# Patient Record
Sex: Male | Born: 1985 | Race: Black or African American | Hispanic: No | Marital: Married | Smoking: Current some day smoker
Health system: Southern US, Community
[De-identification: ages and names within clinical notes are randomized; demographics above are authoritative.]

## PROBLEM LIST (undated history)

## (undated) DIAGNOSIS — I10 Essential (primary) hypertension: Secondary | ICD-10-CM

## (undated) DIAGNOSIS — I2699 Other pulmonary embolism without acute cor pulmonale: Secondary | ICD-10-CM

## (undated) DIAGNOSIS — I824Y9 Acute embolism and thrombosis of unspecified deep veins of unspecified proximal lower extremity: Secondary | ICD-10-CM

---

## 2018-05-23 ENCOUNTER — Encounter (HOSPITAL_COMMUNITY): Payer: Self-pay

## 2018-05-23 ENCOUNTER — Other Ambulatory Visit: Payer: Self-pay

## 2018-05-23 ENCOUNTER — Inpatient Hospital Stay (HOSPITAL_COMMUNITY)
Admission: EM | Admit: 2018-05-23 | Discharge: 2018-05-25 | DRG: 176 | Disposition: A | Discharge status: Home / Self Care | Payer: 59 | Attending: Internal Medicine | Admitting: Internal Medicine

## 2018-05-23 ENCOUNTER — Inpatient Hospital Stay (HOSPITAL_COMMUNITY): Payer: 59

## 2018-05-23 ENCOUNTER — Emergency Department (HOSPITAL_COMMUNITY): Payer: 59

## 2018-05-23 DIAGNOSIS — R55 Syncope and collapse: Secondary | ICD-10-CM | POA: Diagnosis present

## 2018-05-23 DIAGNOSIS — I10 Essential (primary) hypertension: Secondary | ICD-10-CM | POA: Diagnosis present

## 2018-05-23 DIAGNOSIS — Z79899 Other long term (current) drug therapy: Secondary | ICD-10-CM | POA: Diagnosis not present

## 2018-05-23 DIAGNOSIS — Z6835 Body mass index (BMI) 35.0-35.9, adult: Secondary | ICD-10-CM

## 2018-05-23 DIAGNOSIS — R748 Abnormal levels of other serum enzymes: Secondary | ICD-10-CM | POA: Diagnosis not present

## 2018-05-23 DIAGNOSIS — R079 Chest pain, unspecified: Secondary | ICD-10-CM

## 2018-05-23 DIAGNOSIS — I82432 Acute embolism and thrombosis of left popliteal vein: Secondary | ICD-10-CM | POA: Diagnosis present

## 2018-05-23 DIAGNOSIS — I2489 Other forms of acute ischemic heart disease: Secondary | ICD-10-CM

## 2018-05-23 DIAGNOSIS — R778 Other specified abnormalities of plasma proteins: Secondary | ICD-10-CM

## 2018-05-23 DIAGNOSIS — I248 Other forms of acute ischemic heart disease: Secondary | ICD-10-CM | POA: Diagnosis present

## 2018-05-23 DIAGNOSIS — I361 Nonrheumatic tricuspid (valve) insufficiency: Secondary | ICD-10-CM | POA: Diagnosis not present

## 2018-05-23 DIAGNOSIS — F17210 Nicotine dependence, cigarettes, uncomplicated: Secondary | ICD-10-CM | POA: Diagnosis present

## 2018-05-23 DIAGNOSIS — E669 Obesity, unspecified: Secondary | ICD-10-CM | POA: Diagnosis present

## 2018-05-23 DIAGNOSIS — E876 Hypokalemia: Secondary | ICD-10-CM | POA: Diagnosis present

## 2018-05-23 DIAGNOSIS — Z8249 Family history of ischemic heart disease and other diseases of the circulatory system: Secondary | ICD-10-CM | POA: Diagnosis not present

## 2018-05-23 DIAGNOSIS — I272 Pulmonary hypertension, unspecified: Secondary | ICD-10-CM | POA: Diagnosis present

## 2018-05-23 DIAGNOSIS — I2699 Other pulmonary embolism without acute cor pulmonale: Secondary | ICD-10-CM

## 2018-05-23 DIAGNOSIS — R7989 Other specified abnormal findings of blood chemistry: Secondary | ICD-10-CM

## 2018-05-23 DIAGNOSIS — I2602 Saddle embolus of pulmonary artery with acute cor pulmonale: Secondary | ICD-10-CM | POA: Diagnosis present

## 2018-05-23 HISTORY — DX: Essential (primary) hypertension: I10

## 2018-05-23 LAB — BASIC METABOLIC PANEL
ANION GAP: 13 (ref 5–15)
BUN: 14 mg/dL (ref 6–20)
CHLORIDE: 102 mmol/L (ref 101–111)
CO2: 26 mmol/L (ref 22–32)
Calcium: 8.9 mg/dL (ref 8.9–10.3)
Creatinine, Ser: 1.23 mg/dL (ref 0.61–1.24)
GFR calc Af Amer: >60 mL/min (ref >60)
GLUCOSE: 143 mg/dL — AB (ref 65–99)
POTASSIUM: 3.7 mmol/L (ref 3.5–5.1)
SODIUM: 141 mmol/L (ref 135–145)

## 2018-05-23 LAB — CBC
HEMATOCRIT: 43.8 % (ref 39.0–52.0)
HEMOGLOBIN: 14.6 g/dL (ref 13.0–17.0)
MCH: 28.3 pg (ref 26.0–34.0)
MCHC: 33.3 g/dL (ref 30.0–36.0)
MCV: 85 fL (ref 78.0–100.0)
Platelets: 224 10*3/uL (ref 150–400)
RBC: 5.15 MIL/uL (ref 4.22–5.81)
RDW: 13.3 % (ref 11.5–15.5)
WBC: 6.4 10*3/uL (ref 4.0–10.5)

## 2018-05-23 LAB — I-STAT TROPONIN, ED: TROPONIN I, POC: 0.41 ng/mL — AB (ref 0.00–0.08)

## 2018-05-23 LAB — TROPONIN I
TROPONIN I: 1.86 ng/mL — AB (ref <0.03)
Troponin I: 1.43 ng/mL (ref <0.03)

## 2018-05-23 LAB — HEPARIN LEVEL (UNFRACTIONATED): HEPARIN UNFRACTIONATED: 0.33 [IU]/mL (ref 0.30–0.70)

## 2018-05-23 LAB — D-DIMER, QUANTITATIVE (NOT AT ARMC): D DIMER QUANT: 13.15 ug{FEU}/mL — AB (ref 0.00–0.50)

## 2018-05-23 LAB — MRSA PCR SCREENING: MRSA by PCR: NEGATIVE

## 2018-05-23 LAB — CBG MONITORING, ED: Glucose-Capillary: 120 mg/dL — ABNORMAL HIGH (ref 65–99)

## 2018-05-23 MED ORDER — HEPARIN (PORCINE) IN NACL 100-0.45 UNIT/ML-% IJ SOLN
1700.0000 [IU]/h | INTRAMUSCULAR | Status: DC
  Administered 2018-05-23 (×2): 1700 [IU]/h via INTRAVENOUS
  Filled 2018-05-23: qty 250

## 2018-05-23 MED ORDER — IOPAMIDOL (ISOVUE-370) INJECTION 76%
INTRAVENOUS | Status: AC
  Filled 2018-05-23: qty 100

## 2018-05-23 MED ORDER — SODIUM CHLORIDE 0.9 % IV BOLUS: 1000.0000 mL | Freq: Once | INTRAVENOUS | Status: DC

## 2018-05-23 MED ORDER — ACETAMINOPHEN 325 MG PO TABS
650.0000 mg | ORAL_TABLET | Freq: Four times a day (QID) | ORAL | Status: DC | PRN
  Administered 2018-05-24: 650 mg via ORAL
  Filled 2018-05-23: qty 2

## 2018-05-23 MED ORDER — HEPARIN (PORCINE) IN NACL 100-0.45 UNIT/ML-% IJ SOLN
1800.0000 [IU]/h | INTRAMUSCULAR | Status: AC
  Administered 2018-05-24: 1800 [IU]/h via INTRAVENOUS
  Filled 2018-05-23: qty 250

## 2018-05-23 MED ORDER — HEPARIN BOLUS VIA INFUSION
3000.0000 [IU] | Freq: Once | INTRAVENOUS | Status: AC
  Administered 2018-05-23: 3000 [IU] via INTRAVENOUS
  Filled 2018-05-23: qty 3000

## 2018-05-23 MED ORDER — ONDANSETRON HCL 4 MG/2ML IJ SOLN: 4.0000 mg | Freq: Four times a day (QID) | INTRAMUSCULAR | Status: DC | PRN

## 2018-05-23 MED ORDER — SODIUM CHLORIDE 0.9 % IV BOLUS
500.0000 mL | Freq: Once | INTRAVENOUS | Status: AC
  Administered 2018-05-23: 500 mL via INTRAVENOUS

## 2018-05-23 MED ORDER — ONDANSETRON HCL 4 MG PO TABS: 4.0000 mg | ORAL_TABLET | Freq: Four times a day (QID) | ORAL | Status: DC | PRN

## 2018-05-23 MED ORDER — IOPAMIDOL (ISOVUE-370) INJECTION 76%
100.0000 mL | Freq: Once | INTRAVENOUS | Status: AC | PRN
  Administered 2018-05-23: 100 mL via INTRAVENOUS

## 2018-05-23 MED ORDER — HYDROCODONE-ACETAMINOPHEN 5-325 MG PO TABS
1.0000 | ORAL_TABLET | ORAL | Status: DC | PRN
  Administered 2018-05-24: 1 via ORAL
  Administered 2018-05-25: 2 via ORAL
  Filled 2018-05-23: qty 1
  Filled 2018-05-23: qty 2

## 2018-05-23 MED ORDER — ACETAMINOPHEN 650 MG RE SUPP: 650.0000 mg | Freq: Four times a day (QID) | RECTAL | Status: DC | PRN

## 2018-05-23 NOTE — ED Notes (Addendum)
Unable to scan heparin bolus from bag. Documented with not able to scan barcode. Tried multiple times.

## 2018-05-23 NOTE — ED Notes (Signed)
Patient able to speak in complete with any problems sentences and denies pain.

## 2018-05-23 NOTE — Progress Notes (Signed)
CRITICAL VALUE STICKER  CRITICAL VALUE: Troponin 1.43  MD NOTIFIED: MD Choi  TIME OF NOTIFICATION: 1408  RESPONSE: awaiting call back/orders at this time.

## 2018-05-23 NOTE — ED Provider Notes (Signed)
[facility]-EMERGENCY DEPT Provider Note  CSN: [id number] Arrival date & time: 05/23/18 4098  Chief Complaint(s) Loss of Consciousness and Shortness of Breath  HPI Kevin [name] is a 32 y.o. male    The history is provided by the patient.  Loss of Consciousness   This is a new problem. The current episode started 1 to 2 hours ago. Episode frequency: once. The problem has been resolved. He lost consciousness for a period of 1 to 5 minutes. Associated with: walking. Associated symptoms include chest pain (tightness). Pertinent negatives include abdominal pain, back pain, bladder incontinence, bowel incontinence, clumsiness, confusion, congestion, dizziness, fever, focal sensory loss, focal weakness, headaches, malaise/fatigue, nausea, palpitations, seizures, slurred speech, vertigo and vomiting. He has tried nothing for the symptoms. His past medical history is significant for HTN. His past medical history does not include CAD, CVA, DM, seizures, TIA or vertigo.  Shortness of Breath  This is a new problem. The average episode lasts 4 minutes. The problem has been resolved. Associated symptoms include chest pain (tightness), syncope and leg pain (this am). Pertinent negatives include no fever, no headaches, no rhinorrhea, no cough, no sputum production, no vomiting, no abdominal pain and no claudication. Associated medical issues do not include COPD, chronic lung disease, PE, CAD, heart failure, past MI or DVT.   Patient denies any history of recent travel, immobility.  No steroid use.  No personal history of cancer.  No known history of autoimmune/clotting disorders.  Past Medical History Past Medical History:  Diagnosis Date  . Hypertension    Patient Active Problem List   Diagnosis Date Noted  . Pulmonary embolism (HCC) 05/23/2018  . Demand ischemia (HCC) 05/23/2018   Home Medication(s) Prior to Admission medications   Not on File                                                                                                 Past Surgical History History reviewed. No pertinent surgical history. Family History Family History  Problem Relation Age of Onset  . Heart failure Mother     Social History Social History   Tobacco Use  . Smoking status: Current Some Day Smoker    Types: Cigarettes  . Smokeless tobacco: Never Used  Substance Use Topics  . Alcohol use: Not Currently  . Drug use: Yes    Types: Marijuana    Comment: occasionally   Allergies Patient has no known allergies.  Review of Systems Review of Systems  Constitutional: Negative for fever and malaise/fatigue.  HENT: Negative for congestion and rhinorrhea.   Respiratory: Positive for shortness of breath. Negative for cough and sputum production.   Cardiovascular: Positive for chest pain (tightness) and syncope. Negative for palpitations and claudication.  Gastrointestinal: Negative for abdominal pain, bowel incontinence, nausea and vomiting.  Genitourinary: Negative for bladder incontinence.  Musculoskeletal: Negative for back pain.  Neurological: Negative for dizziness, vertigo, focal weakness, seizures and headaches.  Psychiatric/Behavioral: Negative for confusion.   All other systems are reviewed and are negative for acute change except as noted in the HPI  Physical Exam Vital Signs  I have reviewed the triage vital signs BP 126/78 (BP Location: Left Arm)   Pulse 73   Temp 98.2 F (36.8 C) (Oral)   Resp 12   Ht 5' 7.5" (1.715 m)   Wt 104.3 kg (230 lb)   SpO2 99%   BMI 35.49 kg/m   Physical Exam  Constitutional: He is oriented to person, place, and time. He appears well-developed and well-nourished. No distress.  HENT:  Head: Normocephalic and atraumatic.  Nose: Nose normal.  Eyes: Pupils are equal, round, and reactive to light. Conjunctivae and EOM are normal. Right eye exhibits no discharge. Left eye exhibits no discharge. No  scleral icterus.  Neck: Normal range of motion. Neck supple.  Cardiovascular: Normal rate and regular rhythm. Exam reveals gallop and S4. Exam reveals no friction rub.  Murmur heard. Pulmonary/Chest: Effort normal and breath sounds normal. No stridor. No respiratory distress. He has no rales.  Abdominal: Soft. He exhibits no distension. There is no tenderness.  Musculoskeletal: He exhibits no edema or tenderness.  Neurological: He is alert and oriented to person, place, and time.  Skin: Skin is warm and dry. No rash noted. He is not diaphoretic. No erythema.  Psychiatric: He has a normal mood and affect.  Vitals reviewed.   ED Results and Treatments Labs (all labs ordered are listed, but only abnormal results are displayed) Labs Reviewed  BASIC METABOLIC PANEL - Abnormal; Notable for the following components:      Result Value   Glucose, Bld 143 (*)    All other components within normal limits  CBG MONITORING, ED - Abnormal; Notable for the following components:   Glucose-Capillary 120 (*)    All other components within normal limits  I-STAT TROPONIN, ED - Abnormal; Notable for the following components:   Troponin i, poc 0.41 (*)    All other components within normal limits  CBC  D-DIMER, QUANTITATIVE (NOT AT [facility]  HEPARIN LEVEL (UNFRACTIONATED)                                                                                                                         EKG  EKG Interpretation  Date/Time:  Tuesday May 23 2018 08:48:04 EDT Ventricular Rate:  76 PR Interval:    QRS Duration: 110 QT Interval:  398 QTC Calculation: 448 R Axis:   109 Text Interpretation:  Sinus rhythm Borderline prolonged PR interval Consider right ventricular hypertrophy NO STEMI No old tracing to compare Confirmed by Drema Pry [phone]) on 05/23/2018 9:05:59 AM      Radiology Dg Chest 2 View  Result Date: 05/23/2018 CLINICAL DATA:  Chest pain and dizziness. EXAM: CHEST - 2 VIEW COMPARISON:   No prior. FINDINGS: Mediastinum and hilar structures normal. Lungs are clear. No pleural effusion or pneumothorax. Cardiomegaly with mild pulmonary vascular prominence. No acute bony abnormality. IMPRESSION: 1.  Cardiomegaly with mild pulmonary vascular prominence. 2.  No acute or focal pulmonary abnormality identified Electronically Signed   By: Maisie Fus  Register  On: 05/23/2018 10:24   Ct Angio Chest/abd/pel For Dissection W And/or Wo Contrast  Result Date: 05/23/2018 CLINICAL DATA:  Acute chest and back pain. Aortic dissection suspected. Sudden onset of shortness of breath. Syncopal episode. EXAM: CT ANGIOGRAPHY CHEST, ABDOMEN AND PELVIS TECHNIQUE: Multidetector CT imaging through the chest, abdomen and pelvis was performed using the standard protocol during bolus administration of intravenous contrast. Multiplanar reconstructed images and MIPs were obtained and reviewed to evaluate the vascular anatomy. CONTRAST:  ISOVUE-370 IOPAMIDOL (ISOVUE-370) INJECTION 76% COMPARISON:  One-view chest x-ray 05/23/2018. FINDINGS: CTA CHEST FINDINGS Cardiovascular: Heart size is normal. Aortic arch is normal. A 3 vessel arch configuration is present. No focal stenosis, aneurysm, or dissection is present. No significant pericardial effusion is present. A saddle pulmonary embolus is present. There is clot stretching across the bifurcation of the main pulmonary artery. Near occlusive thrombus is present in the proximal lobar arteries bilaterally. The right ventricle measures 4.1 cm maximally. The left ventricle measures 3.8 cm maximally. Mediastinum/Nodes: No significant mediastinal, hilar, or axillary adenopathy is present. Lungs/Pleura: Lungs are clear without focal nodule, mass, or airspace disease. No significant pleural effusion is present. Musculoskeletal: Vertebral body heights alignment are maintained. Ribs are unremarkable. No focal lytic or blastic lesions are present. Review of the MIP images confirms the  above findings. CTA ABDOMEN AND PELVIS FINDINGS VASCULAR Aorta: Normal caliber.  No dissection. Celiac: Patent without evidence of aneurysm, dissection, vasculitis or significant stenosis. SMA: Patent without evidence of aneurysm, dissection, vasculitis or significant stenosis. Renals: Both renal arteries are patent without evidence of aneurysm, dissection, vasculitis, fibromuscular dysplasia or significant stenosis. IMA: Patent without evidence of aneurysm, dissection, vasculitis or significant stenosis. Inflow: Patent without evidence of aneurysm, dissection, vasculitis or significant stenosis. Veins: No obvious venous abnormality within the limitations of this arterial phase study. Review of the MIP images confirms the above findings. NON-VASCULAR Hepatobiliary: Arterial phase imaging demonstrates no focal hepatic lesions. Liver contour is smooth. Liver parenchyma is hypodense. The common bile duct and gallbladder are normal. Pancreas: Unremarkable. No pancreatic ductal dilatation or surrounding inflammatory changes. Spleen: Normal in size without focal abnormality. Adrenals/Urinary Tract: Adrenal glands are normal bilaterally. Kidneys and ureters are unremarkable. The urinary bladder is mildly distended, but within normal limits. Stomach/Bowel: Stomach is mildly distended. No obstructing lesion is present. The duodenum is normal. Small bowel is unremarkable. Terminal ileum is within normal limits. The appendix is visualized and normal. The ascending and transverse colon are within normal limits. The descending and sigmoid colon are normal. Lymphatic: No significant vascular findings are present. No enlarged abdominal or pelvic lymph nodes. Reproductive: Prostate is unremarkable. Other: No abdominal wall hernia or abnormality. No abdominopelvic ascites. Musculoskeletal: Vertebral body heights and alignment are normal. Bony pelvis is within normal limits. The hips are located and within normal limits. Review of  the MIP images confirms the above findings. IMPRESSION: 1. Saddle pulmonary embolus with near occlusive thrombi involving the proximal lobar arteries bilaterally. 2.  Large volume of pulmonary emboli in the lungs bilaterally, with dilatation of the right atrium and right ventricle (RV to LV ratio of 1.1) indicative of elevated right-sided heart pressures and right heart strain. These findings have been shown to be associated with a increased morbidity and mortality in the setting pulmonary embolism. 3. Normal appearance of the aorta and branch vessels without focal stenosis, aneurysm, or aortic dissection. 4. No significant pulmonary parenchymal disease. 5. Probable hepatic steatosis. 6. CT imaging of the abdomen is otherwise unremarkable. Critical Value/emergent results  were called by telephone at the time of interpretation on 05/23/2018 at 10:56 am to Dr. Drema Pry , who verbally acknowledged these results. Electronically Signed   By: Marin Roberts M.D.   On: 05/23/2018 11:03   Pertinent labs & imaging results that were available during my care of the patient were reviewed by me and considered in my medical decision making (see chart for details).  Medications Ordered in ED Medications  heparin ADULT infusion 100 units/mL (25000 units/27mL sodium chloride 0.45%) (1,700 Units/hr Intravenous New Bag/Given 05/23/18 1110)  iopamidol (ISOVUE-370) 76 % injection (has no administration in time range)  sodium chloride 0.9 % bolus 500 mL (0 mLs Intravenous Stopped 05/23/18 1111)  iopamidol (ISOVUE-370) 76 % injection 100 mL (100 mLs Intravenous Contrast Given 05/23/18 1026)  heparin bolus via infusion 3,000 Units (3,000 Units Intravenous Bolus from Bag 05/23/18 1111)                                                                                                                                    Procedures Procedures CRITICAL CARE Performed by: Amadeo Garnet Cardama Total critical care time: 45  minutes Critical care time was exclusive of separately billable procedures and treating other patients. Critical care was necessary to treat or prevent imminent or life-threatening deterioration. Critical care was time spent personally by me on the following activities: development of treatment plan with patient and/or surrogate as well as nursing, discussions with consultants, evaluation of patient's response to treatment, examination of patient, obtaining history from patient or surrogate, ordering and performing treatments and interventions, ordering and review of laboratory studies, ordering and review of radiographic studies, pulse oximetry and re-evaluation of patient's condition.   (including critical care time)  Medical Decision Making / ED Course I have reviewed the nursing notes for this encounter and the patient's prior records (if available in EHR or on provided paperwork).    EKG with evidence of right heart strain.  Heart with notable gallop and murmur.  Initial troponin positive at 0.04.  CTA negative for dissection but noted saddle embolism with right heart strain.  Patient is currently hemodynamically stable without tachycardia or hypotension.  Started on heparin drip.  Interventional radiology consulted for targeted TPA.  Case discussed with medicine for admission and continued management.  Final Clinical Impression(s) / ED Diagnoses Final diagnoses:  Chest pain  Syncope and collapse  Acute saddle pulmonary embolism with acute cor pulmonale (HCC)  Elevated troponin  Demand ischemia (HCC)      This chart was dictated using voice recognition software.  Despite best efforts to proofread,  errors can occur which can change the documentation meaning.   Nira Conn, MD 05/23/18 1136

## 2018-05-23 NOTE — ED Triage Notes (Signed)
Patient states, "I was walking in a hotel and felt like I was out of breath. I found myself lying on the ground and sweaty." Patient denies any chest pain/tightness.

## 2018-05-23 NOTE — Progress Notes (Signed)
Brief Pharmacy Note:    32 year old male on IV heparin infusion for saddle PE + LLE DVT.     1837 heparin level = 0.33 units/mL, on low end of therapeutic range with heparin infusion at 1700 units/hr  CBC WNL   No bleeding or infusion issues per nursing.   Plan:  Will increase heparin infusion to 1800 units/hr to avoid heparin level falling to subtherapeutic range  Heparin level 6 hours after rate change  Monitor CBC and for s/sx of bleeding  Plans to change to Apixaban on 5/29   Greer Pickerel, PharmD, BCPS Pager: [phone] 05/23/2018 8:09 PM

## 2018-05-23 NOTE — Consult Note (Signed)
PULMONARY / CRITICAL CARE MEDICINE   Name: Kevin [name] MRN: [id number] DOB: 1986/01/07    ADMISSION DATE:  05/23/2018 CONSULTATION DATE: May 23, 2018  REFERRING MD: Dr. Alvino Chapel  CHIEF COMPLAINT: Shortness of breath  HISTORY OF PRESENT ILLNESS:   This is a pleasant 32 year old male with no past medical history who came to [facility] long hospital today after syncope.  He says that he was in his usual state of health over the last several weeks and had no specific complaints up until early this morning.  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  He then kneeled over and lost consciousness.  He says he recalls regaining consciousness and people were around him.  He denied any chest pain or leg swelling prior to the event.  He did note some pain in his right thigh previously.  He denies any recent travel, no recent surgery or injury to his leg.  He works 2 full-time jobs both of which are sedentary but he says that he typically will get up and walk around to the hotel during these jobs.  He recently had been sleeping fairly well despite working nights.  He denies any recent illness such as nausea vomiting diarrhea etc.  He has no family history significant for blood clots.  In the emergency department he was found to have large bilateral pulmonary emboli with RV strain on a CT scan of the chest.  He was started on heparin.  He says he now feels well.  No shortness of breath no chest pain.  His vital signs are stable.  PAST MEDICAL HISTORY :  He  has a past medical history of Hypertension.  PAST SURGICAL HISTORY: He  has no past surgical history on file.  No Known Allergies  No current facility-administered medications on file prior to encounter.    No current outpatient medications on file prior to encounter.    FAMILY HISTORY:  His indicated that his mother is deceased. He indicated that  his father is alive.   SOCIAL HISTORY: He  reports that he has been smoking cigarettes.  He has never used smokeless tobacco. He reports that he drank alcohol. He reports that he has current or past drug history. Drug: Marijuana.  REVIEW OF SYSTEMS:   Gen: Denies fever, chills, weight change, fatigue, night sweats HEENT: Denies blurred vision, double vision, hearing loss, tinnitus, sinus congestion, rhinorrhea, sore throat, neck stiffness, dysphagia PULM: per HPI CV: per HPI GI: Denies abdominal pain, nausea, vomiting, diarrhea, hematochezia, melena, constipation, change in bowel habits GU: Denies dysuria, hematuria, polyuria, oliguria, urethral discharge Endocrine: Denies hot or cold intolerance, polyuria, polyphagia or appetite change Derm: Denies rash, dry skin, scaling or peeling skin change Heme: Denies easy bruising, bleeding, bleeding gums Neuro: Denies headache, numbness, weakness, slurred speech, loss of memory or consciousness   SUBJECTIVE:  As above  VITAL SIGNS: BP 136/88   Pulse 96   Temp 98.2 F (36.8 C) (Oral)   Resp (!) 25   Ht 5' 7.5" (1.715 m)   Wt 230 lb (104.3 kg)   SpO2 100%   BMI 35.49 kg/m   HEMODYNAMICS:    VENTILATOR SETTINGS:    INTAKE / OUTPUT: No intake/output data recorded.  PHYSICAL EXAMINATION:  Gen: well appearing, no acute distress HENT: NCAT, OP clear, neck supple without masses Eyes: PERRL, EOMi Lymph: no cervical lymphadenopathy PULM: CTA  B CV: RRR, split S2, no JVD GI: BS+, soft, nontender, no hsm Derm: no rash or skin breakdown MSK: normal bulk and tone Neuro: A&Ox4, CN II-XII intact, strength 5/5 in all 4 extremities Psyche: normal mood and affect   LABS:  BMET Recent Labs  Lab 05/23/18 0850  NA 141  K 3.7  CL 102  CO2 26  BUN 14  CREATININE 1.23  GLUCOSE 143*    Electrolytes Recent Labs  Lab 05/23/18 0850  CALCIUM 8.9    CBC Recent Labs  Lab 05/23/18 0850  WBC 6.4  HGB 14.6  HCT 43.8  PLT  224    Coag's No results for input(s): APTT, INR in the last 168 hours.  Sepsis Markers No results for input(s): LATICACIDVEN, PROCALCITON, O2SATVEN in the last 168 hours.  ABG No results for input(s): PHART, PCO2ART, PO2ART in the last 168 hours.  Liver Enzymes No results for input(s): AST, ALT, ALKPHOS, BILITOT, ALBUMIN in the last 168 hours.  Cardiac Enzymes No results for input(s): TROPONINI, PROBNP in the last 168 hours.  Glucose Recent Labs  Lab 05/23/18 0923  GLUCAP 120*    Imaging Dg Chest 2 View  Result Date: 05/23/2018 CLINICAL DATA:  Chest pain and dizziness. EXAM: CHEST - 2 VIEW COMPARISON:  No prior. FINDINGS: Mediastinum and hilar structures normal. Lungs are clear. No pleural effusion or pneumothorax. Cardiomegaly with mild pulmonary vascular prominence. No acute bony abnormality. IMPRESSION: 1.  Cardiomegaly with mild pulmonary vascular prominence. 2.  No acute or focal pulmonary abnormality identified Electronically Signed   By: Maisie Fus  Register   On: 05/23/2018 10:24   Ct Angio Chest/abd/pel For Dissection W And/or Wo Contrast  Result Date: 05/23/2018 CLINICAL DATA:  Acute chest and back pain. Aortic dissection suspected. Sudden onset of shortness of breath. Syncopal episode. EXAM: CT ANGIOGRAPHY CHEST, ABDOMEN AND PELVIS TECHNIQUE: Multidetector CT imaging through the chest, abdomen and pelvis was performed using the standard protocol during bolus administration of intravenous contrast. Multiplanar reconstructed images and MIPs were obtained and reviewed to evaluate the vascular anatomy. CONTRAST:  ISOVUE-370 IOPAMIDOL (ISOVUE-370) INJECTION 76% COMPARISON:  One-view chest x-ray 05/23/2018. FINDINGS: CTA CHEST FINDINGS Cardiovascular: Heart size is normal. Aortic arch is normal. A 3 vessel arch configuration is present. No focal stenosis, aneurysm, or dissection is present. No significant pericardial effusion is present. A saddle pulmonary embolus is present.  There is clot stretching across the bifurcation of the main pulmonary artery. Near occlusive thrombus is present in the proximal lobar arteries bilaterally. The right ventricle measures 4.1 cm maximally. The left ventricle measures 3.8 cm maximally. Mediastinum/Nodes: No significant mediastinal, hilar, or axillary adenopathy is present. Lungs/Pleura: Lungs are clear without focal nodule, mass, or airspace disease. No significant pleural effusion is present. Musculoskeletal: Vertebral body heights alignment are maintained. Ribs are unremarkable. No focal lytic or blastic lesions are present. Review of the MIP images confirms the above findings. CTA ABDOMEN AND PELVIS FINDINGS VASCULAR Aorta: Normal caliber.  No dissection. Celiac: Patent without evidence of aneurysm, dissection, vasculitis or significant stenosis. SMA: Patent without evidence of aneurysm, dissection, vasculitis or significant stenosis. Renals: Both renal arteries are patent without evidence of aneurysm, dissection, vasculitis, fibromuscular dysplasia or significant stenosis. IMA: Patent without evidence of aneurysm, dissection, vasculitis or significant stenosis. Inflow: Patent without evidence of aneurysm, dissection, vasculitis or significant stenosis. Veins: No obvious venous abnormality within the limitations of this arterial phase study. Review of the MIP images confirms the above findings. NON-VASCULAR Hepatobiliary: Arterial phase imaging  demonstrates no focal hepatic lesions. Liver contour is smooth. Liver parenchyma is hypodense. The common bile duct and gallbladder are normal. Pancreas: Unremarkable. No pancreatic ductal dilatation or surrounding inflammatory changes. Spleen: Normal in size without focal abnormality. Adrenals/Urinary Tract: Adrenal glands are normal bilaterally. Kidneys and ureters are unremarkable. The urinary bladder is mildly distended, but within normal limits. Stomach/Bowel: Stomach is mildly distended. No obstructing  lesion is present. The duodenum is normal. Small bowel is unremarkable. Terminal ileum is within normal limits. The appendix is visualized and normal. The ascending and transverse colon are within normal limits. The descending and sigmoid colon are normal. Lymphatic: No significant vascular findings are present. No enlarged abdominal or pelvic lymph nodes. Reproductive: Prostate is unremarkable. Other: No abdominal wall hernia or abnormality. No abdominopelvic ascites. Musculoskeletal: Vertebral body heights and alignment are normal. Bony pelvis is within normal limits. The hips are located and within normal limits. Review of the MIP images confirms the above findings. IMPRESSION: 1. Saddle pulmonary embolus with near occlusive thrombi involving the proximal lobar arteries bilaterally. 2.  Large volume of pulmonary emboli in the lungs bilaterally, with dilatation of the right atrium and right ventricle (RV to LV ratio of 1.1) indicative of elevated right-sided heart pressures and right heart strain. These findings have been shown to be associated with a increased morbidity and mortality in the setting pulmonary embolism. 3. Normal appearance of the aorta and branch vessels without focal stenosis, aneurysm, or aortic dissection. 4. No significant pulmonary parenchymal disease. 5. Probable hepatic steatosis. 6. CT imaging of the abdomen is otherwise unremarkable. Critical Value/emergent results were called by telephone at the time of interpretation on 05/23/2018 at 10:56 am to Dr. Drema Pry , who verbally acknowledged these results. Electronically Signed   By: Marin Roberts M.D.   On: 05/23/2018 11:03     STUDIES:  May 23, 2018 CT angiogram chest images independently reviewed showing bilateral pulmonary emboli with RV strain, no pulmonary parenchymal abnormality noted, question of hepatic steatosis  CULTURES:   ANTIBIOTICS:   SIGNIFICANT EVENTS:   LINES/TUBES:   DISCUSSION: 32 year old  male admitted with acute pulmonary embolism, CT scan is concerning for RV strain but he is hemodynamically stable.  In terms of risk stratification we typically use the simplified PE severity index score and his results from this are relatively assuring considering his hemodynamic stability but he does have a slightly elevated troponin which increases his risk.  Therefore I agree with his admission to the ICU overnight but I do not feel that he needs thrombolytic therapy based on his normal vital signs.  ASSESSMENT / PLAN:  PULMONARY A: Acute pulmonary embolism Leg pain and swelling likely due to DVT: Unprovoked, will need lifelong anticoagulation discussed risks and benefits of this today. P:   Continue heparin per pharmacy Lower extremity Doppler ultrasound of the right leg to look for DVT Echocardiogram, expect we will see some degree of elevated PA pressure Do not plan on administering thrombolysis at this time Pharmacy consult for anticoagulant, would consider Eliquis   Heber Hidalgo, MD [place] [facility] Pager: [phone] Cell: [phone] After 3pm or if no response, call [phone]  05/23/2018, 1:39 PM

## 2018-05-23 NOTE — ED Notes (Addendum)
Report given to [place], [place]. Nurse made ware of patients orthostatic vital signs and  For patient to use help when ambulating or to use bedside urinal.

## 2018-05-23 NOTE — H&P (Addendum)
History and Physical    Bret Vanessen ZOX:[id number] DOB: 10-11-1986 DOA: 05/23/2018  PCP: Patient, No Pcp Per  Patient coming from: Home  Chief Complaint: Loss of consciousness   HPI: Kevin [name] is a 32 y.o. male with medical history significant of HTN no longer on antihypertensives after he lost 40-50 lbs who presents after syncopal episode.  He states that he was walking down the hallway when he felt shortness of breath, dizziness, chest pressure.  He passed out and presented to the emergency department.  He has never had this happen before.  Otherwise, he has felt well over the weekend and this morning. He denies any prolonged bedrest, surgeries, travel.  He has no known history of blood clots or family history of blood clots. Currently, he is feeling well without any chest pain or shortness of breath at rest in the emergency department.  He denies any fevers, chills, nausea, vomiting, diarrhea, abdominal pain.  ED Course: Troponin 0.41. EKG normal sinus. CTA chest revealed saddle PE. Dr. Eudelia Bunch spoke with IR, who has requested PCCM consult as well for tPA consideration.   Review of Systems: As per HPI otherwise 10 point review of systems negative.   Past Medical History:  Diagnosis Date  . Hypertension     History reviewed. No pertinent surgical history.   reports that he has been smoking cigarettes.  He has never used smokeless tobacco. He reports that he drank alcohol. He reports that he has current or past drug history. Drug: Marijuana.  No Known Allergies  Family History  Problem Relation Age of Onset  . Heart failure Mother     Prior to Admission medications   Not on File    Physical Exam: Vitals:   05/23/18 0844 05/23/18 0845 05/23/18 0953 05/23/18 1115  BP: 126/78  137/87 136/88  Pulse: 73  87 96  Resp: 12  15 (!) 25  Temp: 98.2 F (36.8 C)     TempSrc: Oral     SpO2: 99%  96% 100%  Weight:  104.3 kg (230 lb)    Height:  5' 7.5" (1.715 m)        Constitutional: NAD, calm, comfortable Eyes: PERRL, lids and conjunctivae normal ENMT: Mucous membranes are moist. Posterior pharynx clear of any exudate or lesions.Normal dentition.  Neck: normal, supple, no masses, no thyromegaly Respiratory: clear to auscultation bilaterally, no wheezing, no crackles. Normal respiratory effort. No accessory muscle use.  Cardiovascular: Regular rate and rhythm, no murmurs / rubs / gallops. No extremity edema Abdomen: no tenderness, no masses palpated. No hepatosplenomegaly. Bowel sounds positive.  Musculoskeletal: no clubbing / cyanosis. No joint deformity upper and lower extremities. Good ROM, no contractures. Normal muscle tone.  Skin: no rashes, lesions, ulcers. No induration Neurologic: CN 2-12 grossly intact. Strength 5/5 in all 4.  Psychiatric: Normal judgment and insight. Alert and oriented x 3. Normal mood.   Labs on Admission: I have personally reviewed following labs and imaging studies  CBC: Recent Labs  Lab 05/23/18 0850  WBC 6.4  HGB 14.6  HCT 43.8  MCV 85.0  PLT 224   Basic Metabolic Panel: Recent Labs  Lab 05/23/18 0850  NA 141  K 3.7  CL 102  CO2 26  GLUCOSE 143*  BUN 14  CREATININE 1.23  CALCIUM 8.9   GFR: Estimated Creatinine Clearance: 99.2 mL/min (by C-G formula based on SCr of 1.23 mg/dL). Liver Function Tests: No results for input(s): AST, ALT, ALKPHOS, BILITOT, PROT, ALBUMIN in the last 168  hours. No results for input(s): LIPASE, AMYLASE in the last 168 hours. No results for input(s): AMMONIA in the last 168 hours. Coagulation Profile: No results for input(s): INR, PROTIME in the last 168 hours. Cardiac Enzymes: No results for input(s): CKTOTAL, CKMB, CKMBINDEX, TROPONINI in the last 168 hours. BNP (last 3 results) No results for input(s): PROBNP in the last 8760 hours. HbA1C: No results for input(s): HGBA1C in the last 72 hours. CBG: Recent Labs  Lab 05/23/18 0923  GLUCAP 120*   Lipid  Profile: No results for input(s): CHOL, HDL, LDLCALC, TRIG, CHOLHDL, LDLDIRECT in the last 72 hours. Thyroid Function Tests: No results for input(s): TSH, T4TOTAL, FREET4, T3FREE, THYROIDAB in the last 72 hours. Anemia Panel: No results for input(s): VITAMINB12, FOLATE, FERRITIN, TIBC, IRON, RETICCTPCT in the last 72 hours. Urine analysis: No results found for: COLORURINE, APPEARANCEUR, LABSPEC, PHURINE, GLUCOSEU, HGBUR, BILIRUBINUR, KETONESUR, PROTEINUR, UROBILINOGEN, NITRITE, LEUKOCYTESUR Sepsis Labs: !!!!!!!!!!!!!!!!!!!!!!!!!!!!!!!!!!!!!!!!!!!! (procalcitonin:4,lacticidven:4) )No results found for this or any previous visit (from the past 240 hour(s)).   Radiological Exams on Admission: Dg Chest 2 View  Result Date: 05/23/2018 CLINICAL DATA:  Chest pain and dizziness. EXAM: CHEST - 2 VIEW COMPARISON:  No prior. FINDINGS: Mediastinum and hilar structures normal. Lungs are clear. No pleural effusion or pneumothorax. Cardiomegaly with mild pulmonary vascular prominence. No acute bony abnormality. IMPRESSION: 1.  Cardiomegaly with mild pulmonary vascular prominence. 2.  No acute or focal pulmonary abnormality identified Electronically Signed   By: Maisie Fus  Register   On: 05/23/2018 10:24   Ct Angio Chest/abd/pel For Dissection W And/or Wo Contrast  Result Date: 05/23/2018 CLINICAL DATA:  Acute chest and back pain. Aortic dissection suspected. Sudden onset of shortness of breath. Syncopal episode. EXAM: CT ANGIOGRAPHY CHEST, ABDOMEN AND PELVIS TECHNIQUE: Multidetector CT imaging through the chest, abdomen and pelvis was performed using the standard protocol during bolus administration of intravenous contrast. Multiplanar reconstructed images and MIPs were obtained and reviewed to evaluate the vascular anatomy. CONTRAST:  ISOVUE-370 IOPAMIDOL (ISOVUE-370) INJECTION 76% COMPARISON:  One-view chest x-ray 05/23/2018. FINDINGS: CTA CHEST FINDINGS Cardiovascular: Heart size is normal.  Aortic arch is normal. A 3 vessel arch configuration is present. No focal stenosis, aneurysm, or dissection is present. No significant pericardial effusion is present. A saddle pulmonary embolus is present. There is clot stretching across the bifurcation of the main pulmonary artery. Near occlusive thrombus is present in the proximal lobar arteries bilaterally. The right ventricle measures 4.1 cm maximally. The left ventricle measures 3.8 cm maximally. Mediastinum/Nodes: No significant mediastinal, hilar, or axillary adenopathy is present. Lungs/Pleura: Lungs are clear without focal nodule, mass, or airspace disease. No significant pleural effusion is present. Musculoskeletal: Vertebral body heights alignment are maintained. Ribs are unremarkable. No focal lytic or blastic lesions are present. Review of the MIP images confirms the above findings. CTA ABDOMEN AND PELVIS FINDINGS VASCULAR Aorta: Normal caliber.  No dissection. Celiac: Patent without evidence of aneurysm, dissection, vasculitis or significant stenosis. SMA: Patent without evidence of aneurysm, dissection, vasculitis or significant stenosis. Renals: Both renal arteries are patent without evidence of aneurysm, dissection, vasculitis, fibromuscular dysplasia or significant stenosis. IMA: Patent without evidence of aneurysm, dissection, vasculitis or significant stenosis. Inflow: Patent without evidence of aneurysm, dissection, vasculitis or significant stenosis. Veins: No obvious venous abnormality within the limitations of this arterial phase study. Review of the MIP images confirms the above findings. NON-VASCULAR Hepatobiliary: Arterial phase imaging demonstrates no focal hepatic lesions. Liver contour is smooth. Liver parenchyma is hypodense. The common bile duct  and gallbladder are normal. Pancreas: Unremarkable. No pancreatic ductal dilatation or surrounding inflammatory changes. Spleen: Normal in size without focal abnormality. Adrenals/Urinary  Tract: Adrenal glands are normal bilaterally. Kidneys and ureters are unremarkable. The urinary bladder is mildly distended, but within normal limits. Stomach/Bowel: Stomach is mildly distended. No obstructing lesion is present. The duodenum is normal. Small bowel is unremarkable. Terminal ileum is within normal limits. The appendix is visualized and normal. The ascending and transverse colon are within normal limits. The descending and sigmoid colon are normal. Lymphatic: No significant vascular findings are present. No enlarged abdominal or pelvic lymph nodes. Reproductive: Prostate is unremarkable. Other: No abdominal wall hernia or abnormality. No abdominopelvic ascites. Musculoskeletal: Vertebral body heights and alignment are normal. Bony pelvis is within normal limits. The hips are located and within normal limits. Review of the MIP images confirms the above findings. IMPRESSION: 1. Saddle pulmonary embolus with near occlusive thrombi involving the proximal lobar arteries bilaterally. 2.  Large volume of pulmonary emboli in the lungs bilaterally, with dilatation of the right atrium and right ventricle (RV to LV ratio of 1.1) indicative of elevated right-sided heart pressures and right heart strain. These findings have been shown to be associated with a increased morbidity and mortality in the setting pulmonary embolism. 3. Normal appearance of the aorta and branch vessels without focal stenosis, aneurysm, or aortic dissection. 4. No significant pulmonary parenchymal disease. 5. Probable hepatic steatosis. 6. CT imaging of the abdomen is otherwise unremarkable. Critical Value/emergent results were called by telephone at the time of interpretation on 05/23/2018 at 10:56 am to Dr. Drema Pry , who verbally acknowledged these results. Electronically Signed   By: Marin Roberts M.D.   On: 05/23/2018 11:03    EKG: Independently reviewed. NSR with RBBB   Assessment/Plan Principal Problem:   Pulmonary  embolism (HCC) Active Problems:   Demand ischemia (HCC)   Saddle PE with right heart strain -Echo and venous duplex -IR and PCCM consulted for targeted tPA consideration  -IV heparin   Syncope -Secondary to above, work up as above   Elevated troponin -Likely demand in setting of above -Trend    DVT prophylaxis: IV Heparin Code Status: Full  Family Communication: No family at bedside  Disposition Plan: Pending improvement, further work up  [facility] called: IR called by EDP, spoke with Dr. Kendrick Fries who will see patient in consult  Admission status: Inpatient / stepdown unit   * I certify that at the point of admission it is my clinical judgment that the patient will require inpatient hospital care spanning beyond 2 midnights from the point of admission due to high intensity of service, high risk for further deterioration and high frequency of surveillance required.*   Noralee Stain, DO [facility] [email or web address] Password [facility] 05/23/2018, 11:49 AM

## 2018-05-23 NOTE — ED Notes (Signed)
ED TO INPATIENT HANDOFF REPORT  Name/Age/Gender Kevin [name] 32 y.o. male  Code Status   Home/SNF/Other Home  Chief Complaint syncope / chest tightnes/ SOB   Level of Care/Admitting Diagnosis ED Disposition    ED Disposition Condition [facility] Area: [place] [[id number]]  Level of Care: Stepdown [14]  Admit to SDU based on following criteria: Hemodynamic compromise or significant risk of instability:  Patient requiring short term acute titration and management of vasoactive drips, and invasive monitoring (i.e., CVP and Arterial line).  Diagnosis: Pulmonary embolism [facility]) [[id number]]  Admitting Physician: Dessa Phi [[id number]]  Attending Physician: Dessa Phi [phone]  Estimated length of stay: past midnight tomorrow  Certification:: I certify this patient will need inpatient services for at least 2 midnights  PT Class (Do Not Modify): Inpatient [101]  PT Acc Code (Do Not Modify): Private [1]       Medical History Past Medical History:  Diagnosis Date  . Hypertension     Allergies No Known Allergies  IV Location/Drains/Wounds Patient Lines/Drains/Airways Status   Active Line/Drains/Airways    Name:   Placement date:   Placement time:   Site:   Days:   Peripheral IV 05/23/18 Left;Upper Arm   05/23/18    0949    Arm   less than 1          Labs/Imaging Results for orders placed or performed during the hospital encounter of 05/23/18 (from the past 48 hour(s))  Basic metabolic panel     Status: Abnormal   Collection Time: 05/23/18  8:50 AM  Result Value Ref Range   Sodium 141 135 - 145 mmol/L   Potassium 3.7 3.5 - 5.1 mmol/L   Chloride 102 101 - 111 mmol/L   CO2 26 22 - 32 mmol/L   Glucose, Bld 143 (H) 65 - 99 mg/dL   BUN 14 6 - 20 mg/dL   Creatinine, Ser 1.23 0.61 - 1.24 mg/dL   Calcium 8.9 8.9 - 10.3 mg/dL   GFR calc non Af Amer >60 >60 mL/min   GFR calc Af Amer >60 >60 mL/min    Comment: (NOTE) The eGFR has been  calculated using the CKD EPI equation. This calculation has not been validated in all clinical situations. eGFR's persistently <60 mL/min signify possible Chronic Kidney Disease.    Anion gap 13 5 - 15    Comment: Performed at [facility], [place] [address], [place], [place] [address]  CBC     Status: None   Collection Time: 05/23/18  8:50 AM  Result Value Ref Range   WBC 6.4 4.0 - 10.5 K/uL   RBC 5.15 4.22 - 5.81 MIL/uL   Hemoglobin 14.6 13.0 - 17.0 g/dL   HCT 43.8 39.0 - 52.0 %   MCV 85.0 78.0 - 100.0 fL   MCH 28.3 26.0 - 34.0 pg   MCHC 33.3 30.0 - 36.0 g/dL   RDW 13.3 11.5 - 15.5 %   Platelets 224 150 - 400 K/uL    Comment: Performed at [facility], [place] [address], [place], [place] [address]  CBG monitoring, ED     Status: Abnormal   Collection Time: 05/23/18  9:23 AM  Result Value Ref Range   Glucose-Capillary 120 (H) 65 - 99 mg/dL  I-Stat Troponin, ED (not at [facility])     Status: Abnormal   Collection Time: 05/23/18  9:49 AM  Result Value Ref Range   Troponin i, poc 0.41 (HH)  0.00 - 0.08 ng/mL   Comment NOTIFIED PHYSICIAN    Comment 3            Comment: Due to the release kinetics of cTnI, a negative result within the first hours of the onset of symptoms does not rule out myocardial infarction with certainty. If myocardial infarction is still suspected, repeat the test at appropriate intervals.    Dg Chest 2 View  Result Date: 05/23/2018 CLINICAL DATA:  Chest pain and dizziness. EXAM: CHEST - 2 VIEW COMPARISON:  No prior. FINDINGS: Mediastinum and hilar structures normal. Lungs are clear. No pleural effusion or pneumothorax. Cardiomegaly with mild pulmonary vascular prominence. No acute bony abnormality. IMPRESSION: 1.  Cardiomegaly with mild pulmonary vascular prominence. 2.  No acute or focal pulmonary abnormality identified Electronically Signed   By: Marcello Moores  Register   On: 05/23/2018 10:24   Ct Angio Chest/abd/pel For Dissection W  And/or Wo Contrast  Result Date: 05/23/2018 CLINICAL DATA:  Acute chest and back pain. Aortic dissection suspected. Sudden onset of shortness of breath. Syncopal episode. EXAM: CT ANGIOGRAPHY CHEST, ABDOMEN AND PELVIS TECHNIQUE: Multidetector CT imaging through the chest, abdomen and pelvis was performed using the standard protocol during bolus administration of intravenous contrast. Multiplanar reconstructed images and MIPs were obtained and reviewed to evaluate the vascular anatomy. CONTRAST:  143m ISOVUE-370 IOPAMIDOL (ISOVUE-370) INJECTION 76% COMPARISON:  One-view chest x-ray 05/23/2018. FINDINGS: CTA CHEST FINDINGS Cardiovascular: Heart size is normal. Aortic arch is normal. A 3 vessel arch configuration is present. No focal stenosis, aneurysm, or dissection is present. No significant pericardial effusion is present. A saddle pulmonary embolus is present. There is clot stretching across the bifurcation of the main pulmonary artery. Near occlusive thrombus is present in the proximal lobar arteries bilaterally. The right ventricle measures 4.1 cm maximally. The left ventricle measures 3.8 cm maximally. Mediastinum/Nodes: No significant mediastinal, hilar, or axillary adenopathy is present. Lungs/Pleura: Lungs are clear without focal nodule, mass, or airspace disease. No significant pleural effusion is present. Musculoskeletal: Vertebral body heights alignment are maintained. Ribs are unremarkable. No focal lytic or blastic lesions are present. Review of the MIP images confirms the above findings. CTA ABDOMEN AND PELVIS FINDINGS VASCULAR Aorta: Normal caliber.  No dissection. Celiac: Patent without evidence of aneurysm, dissection, vasculitis or significant stenosis. SMA: Patent without evidence of aneurysm, dissection, vasculitis or significant stenosis. Renals: Both renal arteries are patent without evidence of aneurysm, dissection, vasculitis, fibromuscular dysplasia or significant stenosis. IMA: Patent  without evidence of aneurysm, dissection, vasculitis or significant stenosis. Inflow: Patent without evidence of aneurysm, dissection, vasculitis or significant stenosis. Veins: No obvious venous abnormality within the limitations of this arterial phase study. Review of the MIP images confirms the above findings. NON-VASCULAR Hepatobiliary: Arterial phase imaging demonstrates no focal hepatic lesions. Liver contour is smooth. Liver parenchyma is hypodense. The common bile duct and gallbladder are normal. Pancreas: Unremarkable. No pancreatic ductal dilatation or surrounding inflammatory changes. Spleen: Normal in size without focal abnormality. Adrenals/Urinary Tract: Adrenal glands are normal bilaterally. Kidneys and ureters are unremarkable. The urinary bladder is mildly distended, but within normal limits. Stomach/Bowel: Stomach is mildly distended. No obstructing lesion is present. The duodenum is normal. Small bowel is unremarkable. Terminal ileum is within normal limits. The appendix is visualized and normal. The ascending and transverse colon are within normal limits. The descending and sigmoid colon are normal. Lymphatic: No significant vascular findings are present. No enlarged abdominal or pelvic lymph nodes. Reproductive: Prostate is unremarkable. Other: No abdominal wall hernia  or abnormality. No abdominopelvic ascites. Musculoskeletal: Vertebral body heights and alignment are normal. Bony pelvis is within normal limits. The hips are located and within normal limits. Review of the MIP images confirms the above findings. IMPRESSION: 1. Saddle pulmonary embolus with near occlusive thrombi involving the proximal lobar arteries bilaterally. 2.  Large volume of pulmonary emboli in the lungs bilaterally, with dilatation of the right atrium and right ventricle (RV to LV ratio of 1.1) indicative of elevated right-sided heart pressures and right heart strain. These findings have been shown to be associated with a  increased morbidity and mortality in the setting pulmonary embolism. 3. Normal appearance of the aorta and branch vessels without focal stenosis, aneurysm, or aortic dissection. 4. No significant pulmonary parenchymal disease. 5. Probable hepatic steatosis. 6. CT imaging of the abdomen is otherwise unremarkable. Critical Value/emergent results were called by telephone at the time of interpretation on 05/23/2018 at 10:56 am to Dr. Addison Lank , who verbally acknowledged these results. Electronically Signed   By: San Morelle M.D.   On: 05/23/2018 11:03    Pending Labs Unresulted Labs (From admission, onward)   Start     Ordered   05/24/18 0500  CBC  Daily,   R     05/23/18 1050   05/23/18 1800  Heparin level (unfractionated)  Once-Timed,   R     05/23/18 1050   05/23/18 1000  D-dimer, quantitative (not at [facility])  Once,   R     05/23/18 1000      Vitals/Pain Today's Vitals   05/23/18 0844 05/23/18 0845 05/23/18 0953 05/23/18 1115  BP: 126/78  137/87 136/88  Pulse: 73  87 96  Resp: 12  15 (!) 25  Temp: 98.2 F (36.8 C)     TempSrc: Oral     SpO2: 99%  96% 100%  Weight:  230 lb (104.3 kg)    Height:  5' 7.5" (1.715 m)    PainSc: 0-No pain 0-No pain      Isolation Precautions No active isolations  Medications Medications  heparin ADULT infusion 100 units/mL (25000 units/283m sodium chloride 0.45%) (1,700 Units/hr Intravenous New Bag/Given 05/23/18 1110)  iopamidol (ISOVUE-370) 76 % injection (has no administration in time range)  sodium chloride 0.9 % bolus 500 mL (0 mLs Intravenous Stopped 05/23/18 1111)  iopamidol (ISOVUE-370) 76 % injection 100 mL (100 mLs Intravenous Contrast Given 05/23/18 1026)  heparin bolus via infusion 3,000 Units (3,000 Units Intravenous Bolus from Bag 05/23/18 1111)    Mobility walks with person assist

## 2018-05-23 NOTE — Progress Notes (Signed)
Bilateral lower extremity venous duplex has been completed. There is evidence of acute deep vein thrombosis involving the popliteal vein of the left lower extremity. Negative for DVT on the right. Results were given to the patient's nurse, French Ana.   05/23/18 1:53 PM Olen Cordial RVT

## 2018-05-23 NOTE — Progress Notes (Signed)
ANTICOAGULATION CONSULT NOTE - Initial Consult  Pharmacy Consult for IV heparin Indication: pulmonary embolus  Not on File  Patient Measurements: Height: 5' 7.5" (171.5 cm) Weight: 230 lb (104.3 kg) IBW/kg (Calculated) : 67.25 Heparin Dosing Weight: 91 kg  Vital Signs: Temp: 98.2 F (36.8 C) (05/28 0844) Temp Source: Oral (05/28 0844) BP: 137/87 (05/28 0953) Pulse Rate: 87 (05/28 0953)  Labs: Recent Labs    05/23/18 0850  HGB 14.6  HCT 43.8  PLT 224  CREATININE 1.23    Estimated Creatinine Clearance: 99.2 mL/min (by C-G formula based on SCr of 1.23 mg/dL).   Medical History: Past Medical History:  Diagnosis Date  . Hypertension     Medications:  Scheduled:   Infusions:  . sodium chloride 500 mL (05/23/18 1004)    Assessment: 31 yo male presented to ER with shortness of breath to start IV heparin per pharmacy dosing for PE. Baseline labs drawn  Goal of Therapy:  Heparin level 0.3-0.7 units/ml Monitor platelets by anticoagulation protocol: Yes   Plan:  1) IV heparin 3000 unit bolus then 2) IV heparin infusion rate of 1700 untis/hr 3) Check heparin level 6 hours after start of IV heparin 4) Daily heparin level and CBC   Hessie Knows, PharmD, BCPS Pager [phone] 05/23/2018 10:49 AM

## 2018-05-23 NOTE — ED Notes (Signed)
Dr Eudelia Bunch notified of patient's troponin result.

## 2018-05-23 NOTE — ED Notes (Signed)
Attempted to get an IV twice. Once in left [facility] and once in right [facility]. Sent blood work and will wait for results. Will attempt a 3rd IV once MD has seen patient.

## 2018-05-23 NOTE — Progress Notes (Signed)
[place] tech told RN that pt is is positive for DVT in the Left popliteal. RN Made MD aware Via Text page.  Pt is currently on Heparin gtt.

## 2018-05-24 ENCOUNTER — Inpatient Hospital Stay (HOSPITAL_COMMUNITY): Payer: 59

## 2018-05-24 DIAGNOSIS — R55 Syncope and collapse: Secondary | ICD-10-CM

## 2018-05-24 DIAGNOSIS — R748 Abnormal levels of other serum enzymes: Secondary | ICD-10-CM

## 2018-05-24 DIAGNOSIS — I361 Nonrheumatic tricuspid (valve) insufficiency: Secondary | ICD-10-CM

## 2018-05-24 DIAGNOSIS — I248 Other forms of acute ischemic heart disease: Secondary | ICD-10-CM

## 2018-05-24 LAB — CBC
HCT: 43.5 % (ref 39.0–52.0)
Hemoglobin: 14.4 g/dL (ref 13.0–17.0)
MCH: 27.9 pg (ref 26.0–34.0)
MCHC: 33.1 g/dL (ref 30.0–36.0)
MCV: 84.3 fL (ref 78.0–100.0)
PLATELETS: 210 10*3/uL (ref 150–400)
RBC: 5.16 MIL/uL (ref 4.22–5.81)
RDW: 13.4 % (ref 11.5–15.5)
WBC: 6.5 10*3/uL (ref 4.0–10.5)

## 2018-05-24 LAB — ECHOCARDIOGRAM COMPLETE
Height: 67.5 in
Weight: 3680 oz

## 2018-05-24 LAB — BASIC METABOLIC PANEL
Anion gap: 9 (ref 5–15)
BUN: 9 mg/dL (ref 6–20)
CALCIUM: 8.9 mg/dL (ref 8.9–10.3)
CO2: 26 mmol/L (ref 22–32)
CREATININE: 1.06 mg/dL (ref 0.61–1.24)
Chloride: 104 mmol/L (ref 101–111)
GFR calc Af Amer: >60 mL/min (ref >60)
GLUCOSE: 95 mg/dL (ref 65–99)
Potassium: 3.4 mmol/L — ABNORMAL LOW (ref 3.5–5.1)
Sodium: 139 mmol/L (ref 135–145)

## 2018-05-24 LAB — HIV ANTIBODY (ROUTINE TESTING W REFLEX): HIV Screen 4th Generation wRfx: NONREACTIVE

## 2018-05-24 LAB — HEPARIN LEVEL (UNFRACTIONATED): Heparin Unfractionated: 0.64 IU/mL (ref 0.30–0.70)

## 2018-05-24 LAB — TROPONIN I: TROPONIN I: 1.29 ng/mL — AB (ref <0.03)

## 2018-05-24 MED ORDER — APIXABAN 5 MG PO TABS: 5.0000 mg | ORAL_TABLET | Freq: Two times a day (BID) | ORAL | Status: DC

## 2018-05-24 MED ORDER — MAGNESIUM SULFATE 2 GM/50ML IV SOLN
2.0000 g | Freq: Once | INTRAVENOUS | Status: AC
Start: 2018-05-24 — End: 2018-05-24
  Administered 2018-05-24: 2 g via INTRAVENOUS
  Filled 2018-05-24: qty 50

## 2018-05-24 MED ORDER — AMLODIPINE BESYLATE 10 MG PO TABS
10.0000 mg | ORAL_TABLET | Freq: Every day | ORAL | Status: DC
  Administered 2018-05-24 – 2018-05-25 (×2): 10 mg via ORAL
  Filled 2018-05-24 (×2): qty 1

## 2018-05-24 MED ORDER — APIXABAN 5 MG PO TABS
10.0000 mg | ORAL_TABLET | Freq: Two times a day (BID) | ORAL | Status: DC
  Administered 2018-05-24 – 2018-05-25 (×3): 10 mg via ORAL
  Filled 2018-05-24 (×3): qty 2

## 2018-05-24 MED ORDER — POTASSIUM CHLORIDE CRYS ER 20 MEQ PO TBCR
40.0000 meq | EXTENDED_RELEASE_TABLET | Freq: Once | ORAL | Status: AC
  Administered 2018-05-24: 40 meq via ORAL
  Filled 2018-05-24: qty 2

## 2018-05-24 NOTE — Progress Notes (Signed)
  Echocardiogram 2D Echocardiogram has been performed.  Janalyn Harder 05/24/2018, 10:37 AM

## 2018-05-24 NOTE — Progress Notes (Signed)
ANTICOAGULATION CONSULT NOTE   Pharmacy Consult for heparin Indication: acute pulmonary embolus and DVT  No Known Allergies  Patient Measurements: Height: 5' 7.5" (171.5 cm) Weight: 230 lb (104.3 kg) IBW/kg (Calculated) : 67.25 Heparin Dosing Weight: 90 kg  Vital Signs: Temp: 98.1 F (36.7 C) (05/29 0356) Temp Source: Oral (05/29 0356) BP: 140/97 (05/29 0600)  Labs: Recent Labs    05/23/18 0850 05/23/18 1257 05/23/18 1837 05/24/18 0017 05/24/18 0212  HGB 14.6  --   --   --  14.4  HCT 43.8  --   --   --  43.5  PLT 224  --   --   --  210  HEPARINUNFRC  --   --  0.33  --  0.64  CREATININE 1.23  --   --   --  1.06  TROPONINI  --  1.43* 1.86* 1.29*  --     Estimated Creatinine Clearance: 115.1 mL/min (by C-G formula based on SCr of 1.06 mg/dL).  Assessment: Patient's a 32 y.o M presented to the ED on 5/28 with c/o SOB and syncopal episode.  Chest CTA showed saddle PE with near occlusive thrombi of the proximal lobar arteries and evidence of right heart strain. LE extremity doppler showed acute DVT involving the popliteal of the left lower extremity. He's currently on heparin drip for acute PE/DVT.  Today, 05/24/2018: - heparin level was therapeutic this morning - cbc stable - scr 1.06 (crcl>50) - no bleeding documented   Goal of Therapy:  Heparin level 0.3-0.7 units/ml Monitor platelets by anticoagulation protocol: Yes   Plan:  - d/c heparin drip - start Eliquis 10 mg BID for 7 days, then 5 mg BID - monitor for s/s bleeding  Leather Estis P 05/24/2018,7:06 AM

## 2018-05-24 NOTE — Progress Notes (Signed)
PROGRESS NOTE  Kevin [name] ZOX:[id number] DOB: [birth year] DOA: 05/23/2018 PCP: Patient, No Pcp Per  HPI/Recap of past 24 hours:   Kevin [name] is a 32 y.o. male with medical history significant of HTN no longer on antihypertensives after he lost 40-50 lbs who presents after syncopal episode.  He states that he was walking down the hallway when he felt shortness of breath, dizziness, chest pressure.  He passed out and presented to the emergency department.  He has never had this happen before.  Otherwise, he has felt well over the weekend and this morning. He denies any prolonged bedrest, surgeries, travel.  He has no known history of blood clots or family history of blood clots. Currently, he is feeling well without any chest pain or shortness of breath at rest in the emergency department.  He denies any fevers, chills, nausea, vomiting, diarrhea, abdominal pain.  05/24/2018: Patient seen and examined at his bedside.  Has no new complaints.  Denies chest pain, dyspnea, palpitations or lower extremity tenderness.  Assessment/Plan: Principal Problem:   Pulmonary embolism (HCC) Active Problems:   Demand ischemia (HCC)  Acute saddle PE with right heart strain On heparin drip Case manager has been consulted for Eliquis coverage by his insurance Maintain O2 saturation 92% of greater 2D echo done today pending results  Left lower extremity DVT Continue heparin drip Possible Eliquis if insurance qualifies Avoid SCDs  Hypokalemia Potassium 3.4 Repleted Repeat BMP in the morning  Elevated troponin Peaked at 1.86 Most likely secondary to saddle pulmonary embolism Personally reviewed EKG done today 05/24/2018 which revealed sinus rhythm with no sign of acute ischemia Continue monitoring on telemetry  Hypertension Started Norvasc 10 mg daily  Obesity Lost 20 to 30 pounds with healthy dieting and regular exercise   Code Status: Full code  Family Communication: None at  bedside  Disposition Plan: Home when clinically stable and started on oral anticoagulation   Consultants:  Pharmacy  Case manager  Procedures:  None  Antimicrobials:  None  DVT prophylaxis: Eliquis   Objective: Vitals:   05/24/18 1000 05/24/18 1026 05/24/18 1200 05/24/18 1215  BP: 134/70 134/70 134/90   Pulse:      Resp:      Temp:    98.7 F (37.1 C)  TempSrc:    Oral  SpO2: 98%  98%   Weight:      Height:        Intake/Output Summary (Last 24 hours) at 05/24/2018 1322 Last data filed at 05/24/2018 0500 Gross per 24 hour  Intake 159.6 ml  Output 1900 ml  Net -1740.4 ml   Filed Weights   05/23/18 0845  Weight: 104.3 kg (230 lb)    Exam:  . General: 32 y.o. year-old male well developed well nourished in no acute distress.  Alert and oriented x3. . Cardiovascular: Regular rate and rhythm with no rubs or gallops.  No thyromegaly or JVD noted.   Marland Kitchen Respiratory: Clear to auscultation with no wheezes or rales. Good inspiratory effort. . Abdomen: Soft nontender nondistended with normal bowel sounds x4 quadrants. . Musculoskeletal: No lower extremity edema. 2/4 pulses in all 4 extremities. . Skin: No ulcerative lesions noted or rashes, . Psychiatry: Mood is appropriate for condition and setting   Data Reviewed: CBC: Recent Labs  Lab 05/23/18 0850 05/24/18 0212  WBC 6.4 6.5  HGB 14.6 14.4  HCT 43.8 43.5  MCV 85.0 84.3  PLT 224 210   Basic Metabolic Panel: Recent Labs  Lab 05/23/18  1610 05/24/18 0212  NA 141 139  K 3.7 3.4*  CL 102 104  CO2 26 26  GLUCOSE 143* 95  BUN 14 9  CREATININE 1.23 1.06  CALCIUM 8.9 8.9   GFR: Estimated Creatinine Clearance: 115.1 mL/min (by C-G formula based on SCr of 1.06 mg/dL). Liver Function Tests: No results for input(s): AST, ALT, ALKPHOS, BILITOT, PROT, ALBUMIN in the last 168 hours. No results for input(s): LIPASE, AMYLASE in the last 168 hours. No results for input(s): AMMONIA in the last 168  hours. Coagulation Profile: No results for input(s): INR, PROTIME in the last 168 hours. Cardiac Enzymes: Recent Labs  Lab 05/23/18 1257 05/23/18 1837 05/24/18 0017  TROPONINI 1.43* 1.86* 1.29*   BNP (last 3 results) No results for input(s): PROBNP in the last 8760 hours. HbA1C: No results for input(s): HGBA1C in the last 72 hours. CBG: Recent Labs  Lab 05/23/18 0923  GLUCAP 120*   Lipid Profile: No results for input(s): CHOL, HDL, LDLCALC, TRIG, CHOLHDL, LDLDIRECT in the last 72 hours. Thyroid Function Tests: No results for input(s): TSH, T4TOTAL, FREET4, T3FREE, THYROIDAB in the last 72 hours. Anemia Panel: No results for input(s): VITAMINB12, FOLATE, FERRITIN, TIBC, IRON, RETICCTPCT in the last 72 hours. Urine analysis: No results found for: COLORURINE, APPEARANCEUR, LABSPEC, PHURINE, GLUCOSEU, HGBUR, BILIRUBINUR, KETONESUR, PROTEINUR, UROBILINOGEN, NITRITE, LEUKOCYTESUR Sepsis Labs: (procalcitonin:4,lacticidven:4)  ) Recent Results (from the past 240 hour(s))  MRSA PCR Screening     Status: None   Collection Time: 05/23/18 12:14 PM  Result Value Ref Range Status   MRSA by PCR NEGATIVE NEGATIVE Final    Comment:        The GeneXpert MRSA Assay (FDA approved for NASAL specimens only), is one component of a comprehensive MRSA colonization surveillance program. It is not intended to diagnose MRSA infection nor to guide or monitor treatment for MRSA infections. Performed at [facility], [address], [place], [place] [address]       Studies: No results found.  Scheduled Meds: . amLODipine  10 mg Oral Daily  . apixaban  10 mg Oral BID   Followed by  . [START ON 05/31/2018] apixaban  5 mg Oral BID    Continuous Infusions:   LOS: 1 day     Darlin Drop, MD [facility] Pager [phone]  If 7PM-7AM, please contact night-coverage [email or web address] Password [email or web address] 05/24/2018, 1:22 PM

## 2018-05-24 NOTE — Progress Notes (Signed)
PULMONARY / CRITICAL CARE MEDICINE   Name: Kevin [name] MRN: [id number] DOB: 08/15/1986    Kevin SchnappN DATE:  05/23/2018 CONSULTATION DATE: May 23, 2018  REFERRING MD: Dr. Alvino Chapel  CHIEF COMPLAINT: Shortness of breath  HISTORY OF PRESENT ILLNESS:   This is a pleasant 32 year old male with no past medical history who came to [facility] hospital today after syncope.  He says that he was in his usual state of health over the last several weeks and had no specific complaints up until early this morning.  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  He then kneeled over and lost consciousness.  He says he recalls regaining consciousness and people were around him.  He denied any chest pain or leg swelling prior to the event.  He did note some pain in his right thigh previously.  He denies any recent travel, no recent surgery or injury to his leg.  He works 2 full-time jobs both of which are sedentary but he says that he typically will get up and walk around to the hotel during these jobs.  He recently had been sleeping fairly well despite working nights.  He denies any recent illness such as nausea vomiting diarrhea etc.  He has no family history significant for blood clots.  In the emergency department he was found to have large bilateral pulmonary emboli with RV strain on a CT scan of the chest.  He was started on heparin.  He says he now feels well.  No shortness of breath no chest pain.  His vital signs are stable.   SUBJECTIVE:  Feels better.  Denies chest pain.  VITAL SIGNS: Blood Pressure (Abnormal) 140/97   Pulse 96   Temperature 98.2 F (36.8 C) (Oral)   Respiration (Abnormal) 24   Height 5' 7.5" (1.715 m)   Weight 230 lb (104.3 kg)   Oxygen Saturation 96%   Body Mass Index 35.49 kg/m  Room air    INTAKE / OUTPUT:  Intake/Output Summary (Last 24 hours) at 05/24/2018 0802 Last  data filed at 05/24/2018 0500 Gross per 24 hour  Intake 659.6 ml  Output 1900 ml  Net -1240.4 ml     PHYSICAL EXAMINATION: General: 32 year old male patient resting comfortably in bed he is in no acute distress HEENT normocephalic atraumatic no jugular venous distention Pulmonary: Clear to auscultation no accessory use Cardiac: Regular rate and rhythm Abdomen: Soft nontender Extremities: Trace left lower extremity swelling no pain Neuro: Awake oriented no focal deficits    LABS:  BMET Recent Labs  Lab 05/23/18 0850 05/24/18 0212  NA 141 139  K 3.7 3.4*  CL 102 104  CO2 26 26  BUN 14 9  CREATININE 1.23 1.06  GLUCOSE 143* 95    Electrolytes Recent Labs  Lab 05/23/18 0850 05/24/18 0212  CALCIUM 8.9 8.9    CBC Recent Labs  Lab 05/23/18 0850 05/24/18 0212  WBC 6.4 6.5  HGB 14.6 14.4  HCT 43.8 43.5  PLT 224 210    Cardiac Enzymes Recent Labs  Lab 05/23/18 1257 05/23/18 1837 05/24/18 0017  TROPONINI 1.43* 1.86* 1.29*      Imaging Dg Chest 2 View  Result Date: 05/23/2018 CLINICAL DATA:  Chest pain and dizziness. EXAM: CHEST - 2 VIEW COMPARISON:  No prior. FINDINGS: Mediastinum and hilar structures normal. Lungs are clear. No pleural effusion or pneumothorax. Cardiomegaly with  mild pulmonary vascular prominence. No acute bony abnormality. IMPRESSION: 1.  Cardiomegaly with mild pulmonary vascular prominence. 2.  No acute or focal pulmonary abnormality identified Electronically Signed   By: Maisie Fus  Register   On: 05/23/2018 10:24   Ct Angio Chest/abd/pel For Dissection W And/or Wo Contrast  Result Date: 05/23/2018 CLINICAL DATA:  Acute chest and back pain. Aortic dissection suspected. Sudden onset of shortness of breath. Syncopal episode. EXAM: CT ANGIOGRAPHY CHEST, ABDOMEN AND PELVIS TECHNIQUE: Multidetector CT imaging through the chest, abdomen and pelvis was performed using the standard protocol during bolus administration of intravenous contrast.  Multiplanar reconstructed images and MIPs were obtained and reviewed to evaluate the vascular anatomy. CONTRAST:  ISOVUE-370 IOPAMIDOL (ISOVUE-370) INJECTION 76% COMPARISON:  One-view chest x-ray 05/23/2018. FINDINGS: CTA CHEST FINDINGS Cardiovascular: Heart size is normal. Aortic arch is normal. A 3 vessel arch configuration is present. No focal stenosis, aneurysm, or dissection is present. No significant pericardial effusion is present. A saddle pulmonary embolus is present. There is clot stretching across the bifurcation of the main pulmonary artery. Near occlusive thrombus is present in the proximal lobar arteries bilaterally. The right ventricle measures 4.1 cm maximally. The left ventricle measures 3.8 cm maximally. Mediastinum/Nodes: No significant mediastinal, hilar, or axillary adenopathy is present. Lungs/Pleura: Lungs are clear without focal nodule, mass, or airspace disease. No significant pleural effusion is present. Musculoskeletal: Vertebral body heights alignment are maintained. Ribs are unremarkable. No focal lytic or blastic lesions are present. Review of the MIP images confirms the above findings. CTA ABDOMEN AND PELVIS FINDINGS VASCULAR Aorta: Normal caliber.  No dissection. Celiac: Patent without evidence of aneurysm, dissection, vasculitis or significant stenosis. SMA: Patent without evidence of aneurysm, dissection, vasculitis or significant stenosis. Renals: Both renal arteries are patent without evidence of aneurysm, dissection, vasculitis, fibromuscular dysplasia or significant stenosis. IMA: Patent without evidence of aneurysm, dissection, vasculitis or significant stenosis. Inflow: Patent without evidence of aneurysm, dissection, vasculitis or significant stenosis. Veins: No obvious venous abnormality within the limitations of this arterial phase study. Review of the MIP images confirms the above findings. NON-VASCULAR Hepatobiliary: Arterial phase imaging demonstrates no focal  hepatic lesions. Liver contour is smooth. Liver parenchyma is hypodense. The common bile duct and gallbladder are normal. Pancreas: Unremarkable. No pancreatic ductal dilatation or surrounding inflammatory changes. Spleen: Normal in size without focal abnormality. Adrenals/Urinary Tract: Adrenal glands are normal bilaterally. Kidneys and ureters are unremarkable. The urinary bladder is mildly distended, but within normal limits. Stomach/Bowel: Stomach is mildly distended. No obstructing lesion is present. The duodenum is normal. Small bowel is unremarkable. Terminal ileum is within normal limits. The appendix is visualized and normal. The ascending and transverse colon are within normal limits. The descending and sigmoid colon are normal. Lymphatic: No significant vascular findings are present. No enlarged abdominal or pelvic lymph nodes. Reproductive: Prostate is unremarkable. Other: No abdominal wall hernia or abnormality. No abdominopelvic ascites. Musculoskeletal: Vertebral body heights and alignment are normal. Bony pelvis is within normal limits. The hips are located and within normal limits. Review of the MIP images confirms the above findings. IMPRESSION: 1. Saddle pulmonary embolus with near occlusive thrombi involving the proximal lobar arteries bilaterally. 2.  Large volume of pulmonary emboli in the lungs bilaterally, with dilatation of the right atrium and right ventricle (RV to LV ratio of 1.1) indicative of elevated right-sided heart pressures and right heart strain. These findings have been shown to be associated with a increased morbidity and mortality in the setting pulmonary embolism. 3. Normal  appearance of the aorta and branch vessels without focal stenosis, aneurysm, or aortic dissection. 4. No significant pulmonary parenchymal disease. 5. Probable hepatic steatosis. 6. CT imaging of the abdomen is otherwise unremarkable. Critical Value/emergent results were called by telephone at the time of  interpretation on 05/23/2018 at 10:56 am to Dr. Drema Pry , who verbally acknowledged these results. Electronically Signed   By: Marin Roberts M.D.   On: 05/23/2018 11:03     STUDIES:  May 23, 2018 CT angiogram chest images independently reviewed showing bilateral pulmonary emboli with RV strain, no pulmonary parenchymal abnormality noted, question of hepatic steatosis ECHO 5/29>>> vasc US 5/28: acute DVT left popliteal vein.      ASSESSMENT / PLAN:  Acute pulmonary embolism w/ LLE DVT  DISCUSSION: 32 year old male admitted with acute pulmonary embolism and LLE DVT, CT scan is concerning for RV strain but remains hemodynamically stable.   -trop I trending down -room air   Plan Cont heparin F/u echo OK to transition to Eliquis  Will need follow-up echocardiogram in about 4 to 6 weeks We will set him up for follow-up in our clinic      05/24/2018, 8:02 AM

## 2018-05-24 NOTE — Care Management Note (Signed)
Case Management Note  Patient Details  Name: Kevin [name] MRN: [id number] Date of Birth: [birth year]  Subjective/Objective:                  benfits check for Eliquis  tabs x30 days  Action/Plan: Sent to staff sec.  copay is 145.00  Expected Discharge Date:  (unknown)               Expected Discharge Plan:  Home/Self Care  In-House Referral:     Discharge planning Services  CM Consult, Medication Assistance  Post Acute Care Choice:    Choice offered to:     DME Arranged:    DME Agency:     HH Arranged:    HH Agency:     Status of Service:  In process, will continue to follow  If discussed at Long Length of Stay Meetings, dates discussed:    Additional Comments:  Golda Acre, RN 05/24/2018, 10:00 AM

## 2018-05-24 NOTE — Care Management Note (Signed)
Case Management Note  Patient Details  Name: Kevin [name] MRN: [id number] Date of Birth: Feb 19, 1986  Subjective/Objective:                  eliquis cost  Action/Plan: Request sent to unit sec. To find cost  Expected Discharge Date:  (unknown)               Expected Discharge Plan:  Home/Self Care  In-House Referral:     Discharge planning Services  CM Consult, Medication Assistance  Post Acute Care Choice:    Choice offered to:     DME Arranged:    DME Agency:     HH Arranged:    HH Agency:     Status of Service:  In process, will continue to follow  If discussed at Long Length of Stay Meetings, dates discussed:    Additional Comments:  Golda Acre, RN 05/24/2018, 8:25 AM

## 2018-05-24 NOTE — Progress Notes (Signed)
[facility] PCCM  Echo reviewed, as expected some degree of pulmonary hypertension noted.  Suspect this will resolve with anticoagulation alone (as is true for 95-98% of patients in clinical trials) and doesn't need thrombolysis.  PCCM will write transfer order to floor.  Follow up with pulmonary in one month.  Will need repeat Echo in 6 months.  PCCM will sign off  Heber [place], MD [facility] Pager: [phone] Cell: [phone] After 3pm or if no response, call [phone]

## 2018-05-24 NOTE — Progress Notes (Signed)
Brief Pharmacy Note: IV Heparin   32 year old male on IV heparin infusion for saddle PE + LLE DVT.     0212 heparin level = 0.64 units/mL, at goal.   CBC WNL   No bleeding or infusion issues per nursing.   Plan:  Continue heparin drip at 1800 units/hr  Monitor CBC and for s/sx of bleeding  Plans to change to Apixaban on 5/29    Lorenza Evangelist 05/24/2018 2:55 AM

## 2018-05-25 ENCOUNTER — Telehealth: Payer: Self-pay | Admitting: Hematology and Oncology

## 2018-05-25 LAB — BASIC METABOLIC PANEL
Anion gap: 11 (ref 5–15)
BUN: 9 mg/dL (ref 6–20)
CHLORIDE: 103 mmol/L (ref 101–111)
CO2: 24 mmol/L (ref 22–32)
CREATININE: 0.95 mg/dL (ref 0.61–1.24)
Calcium: 9.2 mg/dL (ref 8.9–10.3)
GFR calc non Af Amer: >60 mL/min (ref >60)
Glucose, Bld: 92 mg/dL (ref 65–99)
POTASSIUM: 3.7 mmol/L (ref 3.5–5.1)
SODIUM: 138 mmol/L (ref 135–145)

## 2018-05-25 LAB — CBC
HCT: 46.8 % (ref 39.0–52.0)
HEMOGLOBIN: 16.2 g/dL (ref 13.0–17.0)
MCH: 28.5 pg (ref 26.0–34.0)
MCHC: 34.6 g/dL (ref 30.0–36.0)
MCV: 82.2 fL (ref 78.0–100.0)
Platelets: 212 10*3/uL (ref 150–400)
RBC: 5.69 MIL/uL (ref 4.22–5.81)
RDW: 13.4 % (ref 11.5–15.5)
WBC: 5.3 10*3/uL (ref 4.0–10.5)

## 2018-05-25 LAB — MAGNESIUM: MAGNESIUM: 2.6 mg/dL — AB (ref 1.7–2.4)

## 2018-05-25 MED ORDER — APIXABAN 5 MG PO TABS: 10.0000 mg | ORAL_TABLET | Freq: Two times a day (BID) | ORAL | 0 refills | Status: DC

## 2018-05-25 MED ORDER — AMLODIPINE BESYLATE 10 MG PO TABS: 10.0000 mg | ORAL_TABLET | Freq: Every day | ORAL | 0 refills | Status: DC

## 2018-05-25 NOTE — Discharge Summary (Signed)
Discharge Summary  Kevin [name] ZOX:[id number] DOB: 05-14-1986  PCP: Patient, No Pcp Per  Admit date: 05/23/2018 Discharge date: 05/25/2018  Time spent: 25 minutes  Recommendations for Outpatient Follow-up:  1. Follow-up with hematology Dr. Pamelia Hoit 2. Follow-up with pulmonology 3. Take your medications as prescribed  Discharge Diagnoses:  Active Hospital Problems   Diagnosis Date Noted  . Pulmonary embolism (HCC) 05/23/2018  . Demand ischemia (HCC) 05/23/2018    Resolved Hospital Problems  No resolved problems to display.    Discharge Condition: Stable  Diet recommendation: Resume previous diet  Vitals:   05/25/18 0800 05/25/18 0916  BP:  (!) 158/93  Pulse:    Resp:    Temp: 98.6 F (37 C)   SpO2:      History of present illness:  Kevin Colsonis a 31 y.o.malewith medical history significant ofHTN no longer on antihypertensives after he lost 40-50 lbs who presents after syncopal episode.He states that he was walking down the hallway when he felt shortness of breath, dizziness, chest pressure. He passed out and presented to the emergency department. He has never had this happen before. Otherwise, he has felt well over the weekend and this morning.He denies any prolonged bedrest, surgeries, travel. He has no known history of blood clots or family history of blood clots. Currently,he is feeling well without any chest pain or shortness of breath at rest in the emergency department. He denies any fevers, chills, nausea, vomiting, diarrhea, abdominal pain.  05/25/2018: Patient seen and examined at his bedside.  Has no new complaints.  Denies chest pain, dyspnea, palpitations or lower extremity tenderness.  On the day of discharge patient was hemodynamically stable.  He will need to follow-up with hematology for possible hypercoagulability work-up. Spoke with Dr. Pamelia Hoit who will be happy to see him in his in his office.  Per Dr. Pamelia Hoit the patient will be contacted by  his office.  Patient will also need to follow-up with pulmonology post hospitalization.   Hospital Course:  Principal Problem:   Pulmonary embolism (HCC) Active Problems:   Demand ischemia (HCC)  Acute saddle PE with right heart strain Continue Eliquis Follow-up with pulmonology Take your medications as prescribed  Left lower extremity DVT Continue Eliquis  Hypokalemia, resolved Potassium 3.7 from 3.4  Elevated troponin Peaked at 1.86 Most likely secondary to saddle pulmonary embolism Personally reviewed EKG done today 05/24/2018 which revealed sinus rhythm with no sign of acute ischemia No chest pain  Hypertension Continue Norvasc 10 mg daily  Obesity Lost 20 to 30 pounds with healthy dieting and regular exercise    Procedures:  None  Consultations:  PCCM  Discharge Exam: BP (!) 158/93   Pulse 96   Temp 98.6 F (37 C) (Oral)   Resp (!) 21   Ht 5' 7.5" (1.715 m)   Wt 104.3 kg (230 lb)   SpO2 95%   BMI 35.49 kg/m  . General: 32 y.o. year-old male well developed well nourished in no acute distress.  Alert and oriented x3. . Cardiovascular: Regular rate and rhythm with no rubs or gallops.  No thyromegaly or JVD noted.   Marland Kitchen Respiratory: Clear to auscultation with no wheezes or rales. Good inspiratory effort. . Abdomen: Soft nontender nondistended with normal bowel sounds x4 quadrants. . Musculoskeletal: No lower extremity edema. 2/4 pulses in all 4 extremities. . Skin: No ulcerative lesions noted or rashes, . Psychiatry: Mood is appropriate for condition and setting  Discharge Instructions You were cared for by a hospitalist during your  hospital stay. If you have any questions about your discharge medications or the care you received while you were in the hospital after you are discharged, you can call the unit and asked to speak with the hospitalist on call if the hospitalist that took care of you is not available. Once you are discharged, your primary  care physician will handle any further medical issues. Please note that NO REFILLS for any discharge medications will be authorized once you are discharged, as it is imperative that you return to your primary care physician (or establish a relationship with a primary care physician if you do not have one) for your aftercare needs so that they can reassess your need for medications and monitor your lab values.   Allergies as of 05/25/2018   No Known Allergies     Medication List    TAKE these medications   amLODipine 10 MG tablet Commonly known as:  NORVASC Take 1 tablet (10 mg total) by mouth daily. Start taking on:  05/26/2018   apixaban 5 MG Tabs tablet Commonly known as:  ELIQUIS Take 2 tablets (10 mg total) by mouth 2 (two) times daily.      No Known Allergies Follow-up Information    Parrett, Virgel Bouquet, NP Follow up on 06/06/2018.   Specialty:  Pulmonary Disease Why:  at 3pm  Contact information: [address] [place] [address] [phone]        Lupita Leash, MD Follow up on 07/05/2018.   Specialty:  Pulmonary Disease Why:  at 215 pm  Contact information: [address] [place] [address] [phone]        Serena Croissant, MD. Call.   Specialty:  Hematology and Oncology Why:  Please call for an appointment. Contact information: [address] [place] [address] [phone]            The results of significant diagnostics from this hospitalization (including imaging, microbiology, ancillary and laboratory) are listed below for reference.    Significant Diagnostic Studies: Dg Chest 2 View  Result Date: 05/23/2018 CLINICAL DATA:  Chest pain and dizziness. EXAM: CHEST - 2 VIEW COMPARISON:  No prior. FINDINGS: Mediastinum and hilar structures normal. Lungs are clear. No pleural effusion or pneumothorax. Cardiomegaly with mild pulmonary vascular prominence. No acute bony abnormality. IMPRESSION: 1.  Cardiomegaly with mild  pulmonary vascular prominence. 2.  No acute or focal pulmonary abnormality identified Electronically Signed   By: Maisie Fus  Register   On: 05/23/2018 10:24   Ct Angio Chest/abd/pel For Dissection W And/or Wo Contrast  Result Date: 05/23/2018 CLINICAL DATA:  Acute chest and back pain. Aortic dissection suspected. Sudden onset of shortness of breath. Syncopal episode. EXAM: CT ANGIOGRAPHY CHEST, ABDOMEN AND PELVIS TECHNIQUE: Multidetector CT imaging through the chest, abdomen and pelvis was performed using the standard protocol during bolus administration of intravenous contrast. Multiplanar reconstructed images and MIPs were obtained and reviewed to evaluate the vascular anatomy. CONTRAST:  ISOVUE-370 IOPAMIDOL (ISOVUE-370) INJECTION 76% COMPARISON:  One-view chest x-ray 05/23/2018. FINDINGS: CTA CHEST FINDINGS Cardiovascular: Heart size is normal. Aortic arch is normal. A 3 vessel arch configuration is present. No focal stenosis, aneurysm, or dissection is present. No significant pericardial effusion is present. A saddle pulmonary embolus is present. There is clot stretching across the bifurcation of the main pulmonary artery. Near occlusive thrombus is present in the proximal lobar arteries bilaterally. The right ventricle measures 4.1 cm maximally. The left ventricle measures 3.8 cm maximally. Mediastinum/Nodes: No significant mediastinal,  hilar, or axillary adenopathy is present. Lungs/Pleura: Lungs are clear without focal nodule, mass, or airspace disease. No significant pleural effusion is present. Musculoskeletal: Vertebral body heights alignment are maintained. Ribs are unremarkable. No focal lytic or blastic lesions are present. Review of the MIP images confirms the above findings. CTA ABDOMEN AND PELVIS FINDINGS VASCULAR Aorta: Normal caliber.  No dissection. Celiac: Patent without evidence of aneurysm, dissection, vasculitis or significant stenosis. SMA: Patent without evidence of aneurysm,  dissection, vasculitis or significant stenosis. Renals: Both renal arteries are patent without evidence of aneurysm, dissection, vasculitis, fibromuscular dysplasia or significant stenosis. IMA: Patent without evidence of aneurysm, dissection, vasculitis or significant stenosis. Inflow: Patent without evidence of aneurysm, dissection, vasculitis or significant stenosis. Veins: No obvious venous abnormality within the limitations of this arterial phase study. Review of the MIP images confirms the above findings. NON-VASCULAR Hepatobiliary: Arterial phase imaging demonstrates no focal hepatic lesions. Liver contour is smooth. Liver parenchyma is hypodense. The common bile duct and gallbladder are normal. Pancreas: Unremarkable. No pancreatic ductal dilatation or surrounding inflammatory changes. Spleen: Normal in size without focal abnormality. Adrenals/Urinary Tract: Adrenal glands are normal bilaterally. Kidneys and ureters are unremarkable. The urinary bladder is mildly distended, but within normal limits. Stomach/Bowel: Stomach is mildly distended. No obstructing lesion is present. The duodenum is normal. Small bowel is unremarkable. Terminal ileum is within normal limits. The appendix is visualized and normal. The ascending and transverse colon are within normal limits. The descending and sigmoid colon are normal. Lymphatic: No significant vascular findings are present. No enlarged abdominal or pelvic lymph nodes. Reproductive: Prostate is unremarkable. Other: No abdominal wall hernia or abnormality. No abdominopelvic ascites. Musculoskeletal: Vertebral body heights and alignment are normal. Bony pelvis is within normal limits. The hips are located and within normal limits. Review of the MIP images confirms the above findings. IMPRESSION: 1. Saddle pulmonary embolus with near occlusive thrombi involving the proximal lobar arteries bilaterally. 2.  Large volume of pulmonary emboli in the lungs bilaterally, with  dilatation of the right atrium and right ventricle (RV to LV ratio of 1.1) indicative of elevated right-sided heart pressures and right heart strain. These findings have been shown to be associated with a increased morbidity and mortality in the setting pulmonary embolism. 3. Normal appearance of the aorta and branch vessels without focal stenosis, aneurysm, or aortic dissection. 4. No significant pulmonary parenchymal disease. 5. Probable hepatic steatosis. 6. CT imaging of the abdomen is otherwise unremarkable. Critical Value/emergent results were called by telephone at the time of interpretation on 05/23/2018 at 10:56 am to Dr. Drema Pry , who verbally acknowledged these results. Electronically Signed   By: Marin Roberts M.D.   On: 05/23/2018 11:03    Microbiology: Recent Results (from the past 240 hour(s))  MRSA PCR Screening     Status: None   Collection Time: 05/23/18 12:14 PM  Result Value Ref Range Status   MRSA by PCR NEGATIVE NEGATIVE Final    Comment:        The GeneXpert MRSA Assay (FDA approved for NASAL specimens only), is one component of a comprehensive MRSA colonization surveillance program. It is not intended to diagnose MRSA infection nor to guide or monitor treatment for MRSA infections. Performed at [facility], [address], [place], [place] [address]      Labs: Basic Metabolic Panel: Recent Labs  Lab 05/23/18 0850 05/24/18 0212 05/25/18 0300  NA 141 139 138  K 3.7 3.4* 3.7  CL 102 104 103  CO2 GLUCOSE 143* 95 92  BUN CREATININE 1.23 1.06 0.95  CALCIUM 8.9 8.9 9.2  MG  --   --  2.6*   Liver Function Tests: No results for input(s): AST, ALT, ALKPHOS, BILITOT, PROT, ALBUMIN in the last 168 hours. No results for input(s): LIPASE, AMYLASE in the last 168 hours. No results for input(s): AMMONIA in the last 168 hours. CBC: Recent Labs  Lab 05/23/18 0850 05/24/18 0212 05/25/18 0300  WBC 6.4 6.5 5.3    HGB 14.6 14.4 16.2  HCT 43.8 43.5 46.8  MCV 85.0 84.3 82.2  PLT 224 210 212   Cardiac Enzymes: Recent Labs  Lab 05/23/18 1257 05/23/18 1837 05/24/18 0017  TROPONINI 1.43* 1.86* 1.29*   BNP: BNP (last 3 results) No results for input(s): BNP in the last 8760 hours.  ProBNP (last 3 results) No results for input(s): PROBNP in the last 8760 hours.  CBG: Recent Labs  Lab 05/23/18 0923  GLUCAP 120*       Signed:  Darlin Drop, MD [facility] 05/25/2018, 11:53 AM

## 2018-05-25 NOTE — Telephone Encounter (Signed)
Lft the pt a vm to return my call and schedule an appt with Dr. Pamelia Hoit.

## 2018-05-26 ENCOUNTER — Telehealth: Payer: Self-pay | Admitting: Hematology and Oncology

## 2018-05-26 NOTE — Telephone Encounter (Signed)
Pt has been scheduled to see Dr. Pamelia Hoit on 6/4 at 345pm. Pt aware to arrive 30 minutes early.

## 2018-05-30 ENCOUNTER — Inpatient Hospital Stay: Payer: 59

## 2018-05-30 ENCOUNTER — Inpatient Hospital Stay: Payer: 59 | Attending: Hematology and Oncology | Admitting: Hematology and Oncology

## 2018-05-30 DIAGNOSIS — I10 Essential (primary) hypertension: Secondary | ICD-10-CM | POA: Diagnosis not present

## 2018-05-30 DIAGNOSIS — I2692 Saddle embolus of pulmonary artery without acute cor pulmonale: Secondary | ICD-10-CM | POA: Diagnosis not present

## 2018-05-30 DIAGNOSIS — I2602 Saddle embolus of pulmonary artery with acute cor pulmonale: Secondary | ICD-10-CM | POA: Diagnosis not present

## 2018-05-30 DIAGNOSIS — Z803 Family history of malignant neoplasm of breast: Secondary | ICD-10-CM | POA: Diagnosis not present

## 2018-05-30 LAB — ANTITHROMBIN III: AntiThromb III Func: 98 % (ref 75–120)

## 2018-05-30 NOTE — Progress Notes (Signed)
Kevin [name], Kevin Pcp Per as PCP - General (General Practice)  CHIEF COMPLAINTS/PURPOSE OF CONSULTATION:  Saddle pulmonary embolism  HISTORY OF PRESENTING ILLNESS:  Kevin [name] 32 y.o. male is here because of recent diagnosis of saddle pulmonary embolism while he was in the hospital.  He has a history of hypertension and lost 40 to 50 pounds.  He felt sudden onset of shortness of breath chest pressure and chest pain.  He has been sent to us to discuss hypercoagulability work-up.  He reports that his symptoms have improved slowly with time. Over the past couple of years, [name] has lost 60 pounds by intermittent fasting and exercising. He has been working 80 hours a week.  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  He has 3 children. I reviewed her records extensively and collaborated the history with the [name].   MEDICAL HISTORY:  Past Medical History:  Diagnosis Date  . Hypertension     SURGICAL HISTORY: Kevin past surgical history on file.  SOCIAL HISTORY: Social History   Socioeconomic History  . Marital status: Married    Spouse name: Not on file  . Number of children: Not on file  . Years of education: Not on file  . Highest education level: Not on file  Occupational History  . Not on file  Social Needs  . Financial resource strain: Not on file  . Food insecurity:    Worry: Not on file    Inability: Not on file  . Transportation needs:    Medical: Not on file    Non-medical: Not on file  Tobacco Use  . Smoking status: Current Some Day Smoker    Types: Cigarettes  . Smokeless tobacco: Never Used  Substance and Sexual Activity  . Alcohol use: Not Currently  . Drug use: Yes    Types: Marijuana    Comment: occasionally  . Sexual activity: Not on file  Lifestyle  . Physical activity:    Days per week: Not on file    Minutes per session: Not on file  . Stress: Not on  file  Relationships  . Social connections:    Talks on phone: Not on file    Gets together: Not on file    Attends religious service: Not on file    Active member of club or organization: Not on file    Attends meetings of clubs or organizations: Not on file    Relationship status: Not on file  . Intimate partner violence:    Fear of current or ex partner: Not on file    Emotionally abused: Not on file    Physically abused: Not on file    Forced sexual activity: Not on file  Other Topics Concern  . Not on file  Social History Narrative  . Not on file    FAMILY HISTORY: Family History  Problem Relation Age of Onset  . Heart failure Mother     ALLERGIES:  has Kevin Known Allergies.  MEDICATIONS:  Current Outpatient Medications  Medication Sig Dispense Refill  . amLODipine (NORVASC) 10 MG tablet Take 1 tablet (10 mg total) by mouth daily. 30 tablet 0  . apixaban (ELIQUIS) 5 MG TABS tablet Take 2 tablets (10 mg total) by mouth 2 (two) times daily. 60 tablet 0   Kevin current facility-administered medications for this visit.     REVIEW OF SYSTEMS:  Constitutional: Denies fevers, chills or abnormal night sweats Eyes: Denies blurriness of vision, double vision or watery eyes Ears, nose, mouth, throat, and face: Denies mucositis or sore throat Respiratory: Denies cough, dyspnea or wheezes Cardiovascular: Denies palpitation, chest discomfort or lower extremity swelling Gastrointestinal:  Denies nausea, heartburn or change in bowel habits Skin: Denies abnormal skin rashes Lymphatics: Denies new lymphadenopathy or easy bruising Neurological:Denies numbness, tingling or new weaknesses Behavioral/Psych: Mood is stable, Kevin new changes   All other systems were reviewed with the [name] and are negative.  PHYSICAL EXAMINATION: ECOG PERFORMANCE STATUS: 1 - Symptomatic but completely ambulatory  Vitals:   05/30/18 1540  BP: (!) 139/3  Pulse: 86  Resp: 18  Temp: 98.4 F (36.9 C)   SpO2: 99%   Filed Weights   05/30/18 1540  Weight: 230 lb 11.2 oz (104.6 kg)    GENERAL:alert, Kevin distress and comfortable SKIN: skin color, texture, turgor are normal, Kevin rashes or significant lesions EYES: normal, conjunctiva are pink and non-injected, sclera clear OROPHARYNX:Kevin exudate, Kevin erythema and lips, buccal mucosa, and tongue normal  NECK: supple, thyroid normal size, non-tender, without nodularity LYMPH:  Kevin palpable lymphadenopathy in the cervical, axillary or inguinal LUNGS: clear to auscultation and percussion with normal breathing effort HEART: regular rate & rhythm and Kevin murmurs and Kevin lower extremity edema ABDOMEN:abdomen soft, non-tender and normal bowel sounds Musculoskeletal:Kevin cyanosis of digits and Kevin clubbing  PSYCH: alert & oriented x 3 with fluent speech   LABORATORY DATA:  I have reviewed the data as listed Lab Results  Component Value Date   WBC 5.3 05/25/2018   HGB 16.2 05/25/2018   HCT 46.8 05/25/2018   MCV 82.2 05/25/2018   PLT 212 05/25/2018   Lab Results  Component Value Date   NA 138 05/25/2018   K 3.7 05/25/2018   CL 103 05/25/2018   CO2 24 05/25/2018    RADIOGRAPHIC STUDIES: I have personally reviewed the radiological reports and agreed with the findings in the report.  ASSESSMENT AND PLAN:  Pulmonary embolism (HCC) 05/15/2018: Acute chest and back pain: CT chest: Saddle pulmonary embolism with near occlusive thrombi involving the proximal lobar arteries bilaterally, large volume PE in the lungs bilaterally with with dilation of right atrium and right ventricle  Current treatment: Eliquis 10 mg p.o. twice daily Eliquis toxicities: None  Hypercoagulable risk factors.  I will send out the hypercoagulability panel and will see the [name] in 2 to 3 weeks to discuss results. [name] does not have any family history of blood clots. He has 2 family members with breast cancer. The CT scan he had done in the hospital did not show any  evidence of cancer.  We will determine the duration of anticoagulation based upon the test results. The minimum duration of anticoagulation in this [name] is 1 year.  If he has additional risk factors he may remain on anticoagulation for life. All questions were answered. The [name] knows to call the clinic with any problems, questions or concerns.    Tamsen Meek, MD 05/30/18

## 2018-05-30 NOTE — Assessment & Plan Note (Signed)
05/15/2018: Acute chest and back pain: CT chest: Saddle pulmonary embolism with near occlusive thrombi involving the proximal lobar arteries bilaterally, large volume PE in the lungs bilaterally with with dilation of right atrium and right ventricle  Current treatment: Eliquis 10 mg p.o. twice daily Eliquis toxicities: None  Hypercoagulable risk factors.  I will send out the hypercoagulability panel and will see the patient in 2 to 3 weeks to discuss results.

## 2018-05-31 LAB — PROTEIN S, TOTAL: PROTEIN S AG TOTAL: 103 % (ref 60–150)

## 2018-05-31 LAB — HOMOCYSTEINE: Homocysteine: 13 umol/L (ref 0.0–15.0)

## 2018-06-01 LAB — LUPUS ANTICOAGULANT PANEL
DRVVT: 48.3 s — ABNORMAL HIGH (ref 0.0–47.0)
PTT Lupus Anticoagulant: 39.1 s (ref 0.0–51.9)

## 2018-06-01 LAB — BETA-2-GLYCOPROTEIN I ABS, IGG/M/A: Beta-2 Glyco I IgG: <9 GPI IgG units (ref 0–20)

## 2018-06-01 LAB — CARDIOLIPIN ANTIBODIES, IGG, IGM, IGA
Anticardiolipin IgA: <9 APL U/mL (ref 0–11)
Anticardiolipin IgM: <9 MPL U/mL (ref 0–12)

## 2018-06-01 LAB — PROTEIN C, TOTAL: PROTEIN C, TOTAL: 68 % (ref 60–150)

## 2018-06-01 LAB — DRVVT MIX: DRVVT MIX: 42.5 s (ref 0.0–47.0)

## 2018-06-05 LAB — PROTHROMBIN GENE MUTATION

## 2018-06-06 ENCOUNTER — Emergency Department (HOSPITAL_COMMUNITY)
Admission: EM | Admit: 2018-06-06 | Discharge: 2018-06-06 | Disposition: A | Discharge status: Home / Self Care | Payer: 59 | Attending: Emergency Medicine | Admitting: Emergency Medicine

## 2018-06-06 ENCOUNTER — Encounter (HOSPITAL_COMMUNITY): Payer: Self-pay | Admitting: *Deleted

## 2018-06-06 ENCOUNTER — Inpatient Hospital Stay: Payer: 59 | Admitting: Adult Health

## 2018-06-06 ENCOUNTER — Emergency Department (HOSPITAL_COMMUNITY): Payer: 59

## 2018-06-06 DIAGNOSIS — Z79899 Other long term (current) drug therapy: Secondary | ICD-10-CM | POA: Diagnosis not present

## 2018-06-06 DIAGNOSIS — R002 Palpitations: Secondary | ICD-10-CM | POA: Diagnosis present

## 2018-06-06 DIAGNOSIS — Z7901 Long term (current) use of anticoagulants: Secondary | ICD-10-CM | POA: Insufficient documentation

## 2018-06-06 DIAGNOSIS — F17210 Nicotine dependence, cigarettes, uncomplicated: Secondary | ICD-10-CM | POA: Insufficient documentation

## 2018-06-06 MED ORDER — APIXABAN 5 MG PO TABS: 5.0000 mg | ORAL_TABLET | Freq: Every day | ORAL | 0 refills | Status: DC

## 2018-06-06 NOTE — ED Provider Notes (Signed)
[facility]-EMERGENCY DEPT Provider Note   CSN: [id number] Arrival date & time: 06/06/18  8295     History   Chief Complaint Chief Complaint  Patient presents with  . Palpitations  . Shortness of Breath    HPI Lateef Juncaj is a 32 y.o. male.  Patient had approximately 2-second episode of palpitations along with shortness of breath earlier today that resolved without difficulty.  He was not exerting himself when this happened.  He recently had a blood clot and is on Eliquis and was concerned about it so came here for further evaluation.  Is been asymptomatic ever since this.  He has worked out lately since his PE diagnosis and has not had any issues.     Past Medical History:  Diagnosis Date  . Hypertension     Patient Active Problem List   Diagnosis Date Noted  . Pulmonary embolism (HCC) 05/23/2018  . Demand ischemia (HCC) 05/23/2018    History reviewed. No pertinent surgical history.      Home Medications    Prior to Admission medications   Medication Sig Start Date End Date Taking? Authorizing Provider  amLODipine (NORVASC) 10 MG tablet Take 1 tablet (10 mg total) by mouth daily. 05/26/18  Yes Darlin Drop, DO  apixaban (ELIQUIS) 5 MG TABS tablet Take 1 tablet (5 mg total) by mouth daily. 06/06/18   Aizlynn Digilio, Barbara Cower, MD    Family History Family History  Problem Relation Age of Onset  . Heart failure Mother     Social History Social History   Tobacco Use  . Smoking status: Current Some Day Smoker    Types: Cigarettes  . Smokeless tobacco: Never Used  Substance Use Topics  . Alcohol use: Not Currently  . Drug use: Yes    Types: Marijuana    Comment: occasionally     Allergies   Patient has no known allergies.   Review of Systems Review of Systems  All other systems reviewed and are negative.    Physical Exam Updated Vital Signs BP (!) 132/97   Pulse 62   Temp 97.8 F (36.6 C) (Oral)   Resp 18   SpO2 100%    Physical Exam  Constitutional: He appears well-developed and well-nourished.  HENT:  Head: Normocephalic and atraumatic.  Eyes: Pupils are equal, round, and reactive to light.  Neck: Normal range of motion.  Cardiovascular: Normal rate. Exam reveals no gallop and no friction rub.  No murmur heard. Pulmonary/Chest: Effort normal. No respiratory distress. He has no wheezes. He has no rales.  Abdominal: He exhibits no distension.  Musculoskeletal: Normal range of motion.  Neurological: He is alert.  Nursing note and vitals reviewed.    ED Treatments / Results  Labs (all labs ordered are listed, but only abnormal results are displayed) Labs Reviewed - No data to display  EKG EKG Interpretation  Date/Time:  Tuesday June 06 2018 09:06:10 EDT Ventricular Rate:  75 PR Interval:    QRS Duration: 103 QT Interval:  367 QTC Calculation: 410 R Axis:   123 Text Interpretation:  Sinus rhythm Consider right ventricular hypertrophy ST elevation, consider lateral injury new TWI in III since may 28 with q waves Confirmed by Marily Memos [phone]) on 06/06/2018 11:27:45 AM   Radiology Dg Chest 2 View  Result Date: 06/06/2018 CLINICAL DATA:  32 year old complains of shortness of breath. Recent pulmonary embolism. EXAM: CHEST - 2 VIEW COMPARISON:  05/23/2018 FINDINGS: Both lungs are clear. Heart and mediastinum are within  normal limits. Trachea is midline. No pleural effusions. Bone structures are unremarkable. IMPRESSION: Normal chest examination. Electronically Signed   By: Richarda OverlieAdam  Henn M.D.   On: 06/06/2018 09:36    Procedures Procedures (including critical care time)  Medications Ordered in ED Medications - No data to display   Initial Impression / Assessment and Plan / ED Course  I have reviewed the triage vital signs and the nursing notes.  Pertinent labs & imaging results that were available during my care of the patient were reviewed by me and considered in my medical decision  making (see chart for details).     Low suspicion for worsening disease.  Unclear etiology of the symptoms I doubt arrhythmia at this time but if it persists he will need a Holter monitor.  If it comes more consistent and longer duration will return here for further evaluation.  Final Clinical Impressions(s) / ED Diagnoses   Final diagnoses:  Heart palpitations  Palpitations    ED Discharge Orders        Ordered    apixaban (ELIQUIS) 5 MG TABS tablet  Daily     06/06/18 1454       Asuncion Tapscott, Barbara CowerJason, MD 06/06/18 1633

## 2018-06-06 NOTE — ED Triage Notes (Signed)
Pt felt heart palpitation and shortness of breath ~1 hour prior to arrival to ED. Pt was seen for pulmonary embolism 2 weeks ago. Pt denies symptoms at this time.

## 2018-06-07 ENCOUNTER — Other Ambulatory Visit: Payer: Self-pay

## 2018-06-07 LAB — FACTOR 5 LEIDEN

## 2018-06-07 MED ORDER — APIXABAN 5 MG PO TABS: 5.0000 mg | ORAL_TABLET | Freq: Two times a day (BID) | ORAL | 0 refills | Status: DC

## 2018-06-07 NOTE — Telephone Encounter (Signed)
Called pt and lvm with call back number to notify her that she will need to take eloquis 5mg  po BID. Re sent pharmacy clarification on qty and frequency of medication. Advised that pt call to confirm receipt of vm.

## 2018-06-12 ENCOUNTER — Inpatient Hospital Stay: Payer: 59 | Admitting: Hematology and Oncology

## 2018-06-12 NOTE — Assessment & Plan Note (Deleted)
05/15/2018: Acute chest and back pain: CT chest: Saddle pulmonary embolism with near occlusive thrombi involving the proximal lobar arteries bilaterally, large volume PE in the lungs bilaterally with with dilation of right atrium and right ventricle  Current treatment: Eliquis 5 mg p.o. twice daily Eliquis toxicities: None  Lab result review: 1.  Lupus anticoagulant testing: No lupus anticoagulant  2. antibody test for beta-2 glycoprotein 1 and anticardiolipin antibodies were negative 3.  Serum homocystine normal at 13 4.  Antithrombin III 98% normal 5.  Protein C and protein S 68% on 103% normal 6.  Prothrombin gene mutation and factor V Leiden normal   I discussed with the patient that all the above tests were normal. I recommended that the patient should remain on anticoagulation for 1 year

## 2018-06-12 NOTE — Progress Notes (Deleted)
Patient Care Team: Patient, No Pcp Per as PCP - General (General Practice)  DIAGNOSIS:  Encounter Diagnosis  Name Primary?  . Acute saddle pulmonary embolism with acute cor pulmonale (HCC)    CHIEF COMPLIANT: Follow-up to review the blood work done recently for work-up of acute saddle pulmonary embolism  INTERVAL HISTORY: Kevin [name] is a 32 year old with above-mentioned history of basilar blood clot in the lung who is here after undergoing blood work for hypercoagulability.  All the tests came back normal.  There is no clear-cut etiology.  He appears to be tolerating the Eliquis extremely well.  REVIEW OF SYSTEMS:   Constitutional: Denies fevers, chills or abnormal weight loss Eyes: Denies blurriness of vision Ears, nose, mouth, throat, and face: Denies mucositis or sore throat Respiratory: Denies cough, dyspnea or wheezes Cardiovascular: Denies palpitation, chest discomfort Gastrointestinal:  Denies nausea, heartburn or change in bowel habits Skin: Denies abnormal skin rashes Lymphatics: Denies new lymphadenopathy or easy bruising Neurological:Denies numbness, tingling or new weaknesses Behavioral/Psych: Mood is stable, no new changes  Extremities: No lower extremity edema Breast: Benign All other systems were reviewed with the patient and are negative.  I have reviewed the past medical history, past surgical history, social history and family history with the patient and they are unchanged from previous note.  ALLERGIES:  has No Known Allergies.  MEDICATIONS:  Current Outpatient Medications  Medication Sig Dispense Refill  . amLODipine (NORVASC) 10 MG tablet Take 1 tablet (10 mg total) by mouth daily. 30 tablet 0  . apixaban (ELIQUIS) 5 MG TABS tablet Take 1 tablet (5 mg total) by mouth 2 (two) times daily. 60 tablet 0   No current facility-administered medications for this visit.     PHYSICAL EXAMINATION: ECOG PERFORMANCE STATUS: {CHL ONC ECOG PS:[phone]}  There  were no vitals filed for this visit. There were no vitals filed for this visit.  GENERAL:alert, no distress and comfortable SKIN: skin color, texture, turgor are normal, no rashes or significant lesions EYES: normal, Conjunctiva are pink and non-injected, sclera clear OROPHARYNX:no exudate, no erythema and lips, buccal mucosa, and tongue normal  NECK: supple, thyroid normal size, non-tender, without nodularity LYMPH:  no palpable lymphadenopathy in the cervical, axillary or inguinal LUNGS: clear to auscultation and percussion with normal breathing effort HEART: regular rate & rhythm and no murmurs and no lower extremity edema ABDOMEN:abdomen soft, non-tender and normal bowel sounds MUSCULOSKELETAL:no cyanosis of digits and no clubbing  NEURO: alert & oriented x 3 with fluent speech, no focal motor/sensory deficits EXTREMITIES: No lower extremity edema BREAST:*** No palpable masses or nodules in either right or left breasts. No palpable axillary supraclavicular or infraclavicular adenopathy no breast tenderness or nipple discharge. (exam performed in the presence of a chaperone)  LABORATORY DATA:  I have reviewed the data as listed CMP Latest Ref Rng & Units 05/25/2018 05/24/2018 05/23/2018  Glucose 65 - 99 mg/dL 92 95 161(W)  BUN 6 - 20 mg/dL 9 9 14   Creatinine 0.61 - 1.24 mg/dL 9.60 4.54 0.98  Sodium 135 - 145 mmol/L 138 139 141  Potassium 3.5 - 5.1 mmol/L 3.7 3.4(L) 3.7  Chloride 101 - 111 mmol/L 103 104 102  CO2 22 - 32 mmol/L 24 26 26   Calcium 8.9 - 10.3 mg/dL 9.2 8.9 8.9    Lab Results  Component Value Date   WBC 5.3 05/25/2018   HGB 16.2 05/25/2018   HCT 46.8 05/25/2018   MCV 82.2 05/25/2018   PLT 212 05/25/2018  ASSESSMENT & PLAN:  Pulmonary embolism (HCC) 05/15/2018: Acute chest and back pain: CT chest: Saddle pulmonary embolism with near occlusive thrombi involving the proximal lobar arteries bilaterally, large volume PE in the lungs bilaterally with with dilation of  right atrium and right ventricle  Current treatment: Eliquis 5 mg p.o. twice daily Eliquis toxicities: None  Lab result review: 1.  Lupus anticoagulant testing: No lupus anticoagulant  2. antibody test for beta-2 glycoprotein 1 and anticardiolipin antibodies were negative 3.  Serum homocystine normal at 13 4.  Antithrombin III 98% normal 5.  Protein C and protein S 68% on 103% normal 6.  Prothrombin gene mutation and factor V Leiden normal   I discussed with the patient that all the above tests were normal. I recommended that the patient should remain on anticoagulation for 1 year      No orders of the defined types were placed in this encounter.  The patient has a good understanding of the overall plan. he agrees with it. he will call with any problems that may develop before the next visit here.   Tamsen MeekViinay K Micki Cassel, MD 06/12/18

## 2018-07-05 ENCOUNTER — Ambulatory Visit: Payer: 59 | Admitting: Pulmonary Disease

## 2018-07-05 ENCOUNTER — Telehealth: Payer: Self-pay | Admitting: Hematology and Oncology

## 2018-07-05 ENCOUNTER — Other Ambulatory Visit: Payer: Self-pay

## 2018-07-05 DIAGNOSIS — I248 Other forms of acute ischemic heart disease: Secondary | ICD-10-CM

## 2018-07-05 MED ORDER — AMLODIPINE BESYLATE 10 MG PO TABS: 10.0000 mg | ORAL_TABLET | Freq: Every day | ORAL | 0 refills | Status: DC

## 2018-07-05 NOTE — Telephone Encounter (Signed)
Called pt re appt that was added per 7/10 sch msg - left vm for pt re appts.

## 2018-07-05 NOTE — Progress Notes (Deleted)
Synopsis: First seen in May 2019 in the setting of a pulmonary embolism and unprovoked pulmonary embolus  Subjective:   PATIENT ID: Kevin [name] GENDER: male DOB: January 29, 1986, MRN: [id number]   HPI  No chief complaint on file.   ***  Past Medical History:  Diagnosis Date  . Hypertension      Family History  Problem Relation Age of Onset  . Heart failure Mother      Social History   Socioeconomic History  . Marital status: Married    Spouse name: Not on file  . Number of children: Not on file  . Years of education: Not on file  . Highest education level: Not on file  Occupational History  . Not on file  Social Needs  . Financial resource strain: Not on file  . Food insecurity:    Worry: Not on file    Inability: Not on file  . Transportation needs:    Medical: Not on file    Non-medical: Not on file  Tobacco Use  . Smoking status: Current Some Day Smoker    Types: Cigarettes  . Smokeless tobacco: Never Used  Substance and Sexual Activity  . Alcohol use: Not Currently  . Drug use: Yes    Types: Marijuana    Comment: occasionally  . Sexual activity: Not on file  Lifestyle  . Physical activity:    Days per week: Not on file    Minutes per session: Not on file  . Stress: Not on file  Relationships  . Social connections:    Talks on phone: Not on file    Gets together: Not on file    Attends religious service: Not on file    Active member of club or organization: Not on file    Attends meetings of clubs or organizations: Not on file    Relationship status: Not on file  . Intimate partner violence:    Fear of current or ex partner: Not on file    Emotionally abused: Not on file    Physically abused: Not on file    Forced sexual activity: Not on file  Other Topics Concern  . Not on file  Social History Narrative  . Not on file     No Known Allergies   Outpatient Medications Prior to Visit  Medication Sig Dispense Refill  . amLODipine (NORVASC)  10 MG tablet Take 1 tablet (10 mg total) by mouth daily. 30 tablet 0  . apixaban (ELIQUIS) 5 MG TABS tablet Take 1 tablet (5 mg total) by mouth 2 (two) times daily. 60 tablet 0   No facility-administered medications prior to visit.     ROS    Objective:  Physical Exam   There were no vitals filed for this visit.  ***  CBC    Component Value Date/Time   WBC 5.3 05/25/2018 0300   RBC 5.69 05/25/2018 0300   HGB 16.2 05/25/2018 0300   HCT 46.8 05/25/2018 0300   PLT 212 05/25/2018 0300   MCV 82.2 05/25/2018 0300   MCH 28.5 05/25/2018 0300   MCHC 34.6 05/25/2018 0300   RDW 13.4 05/25/2018 0300     Chest imaging: May 2019 CT angiogram chest showing bilateral large proximal pulmonary emboli otherwise normal-appearing pulmonary parenchyma, images and dependently reviewed  PFT:  Labs:  Path:  Echo: May 2019 LVEF 50 to 55%, RVSP 57  Heart Catheterization:  Hospitalization records from May 2019 reviewed where the patient was seen for bilateral pulmonary embolism,  treated with Eliquis Emergency room records from June 2019 reviewed where he came back to the hospital complaining of palpitations.  An EKG was worrisome for RV hypertrophy, he was discharged home    Assessment & Plan:   No diagnosis found.  Discussion: ***    Current Outpatient Medications:  .  amLODipine (NORVASC) 10 MG tablet, Take 1 tablet (10 mg total) by mouth daily., Disp: 30 tablet, Rfl: 0 .  apixaban (ELIQUIS) 5 MG TABS tablet, Take 1 tablet (5 mg total) by mouth 2 (two) times daily., Disp: 60 tablet, Rfl: 0

## 2018-07-05 NOTE — Telephone Encounter (Signed)
Received refill request from [facility] for pt's Amlodipine.  Noted pt missed f/u appt on June 17th with Dr Pamelia HoitGudena.  Submitted inbasket to scheduling to contact pt with appt ASAP.  I called pt, no answer, left him a VM to let him know the refill will be submitted this time, but that he needs to f/u with Dr Pamelia HoitGudena and left our contact info for him to call for appt. I also reminded him, he has appt with [facility], at 2:15 pm today.

## 2018-07-06 ENCOUNTER — Other Ambulatory Visit: Payer: Self-pay | Admitting: *Deleted

## 2018-07-10 ENCOUNTER — Inpatient Hospital Stay: Payer: 59 | Attending: Hematology and Oncology | Admitting: Hematology and Oncology

## 2019-05-28 IMAGING — CR DG CHEST 2V
2 series · 2 of 2 positions shown · non-contrast
Comparison: No prior.

CLINICAL DATA: Chest pain and dizziness.

EXAM:
CHEST - 2 VIEW

[x chest ap]
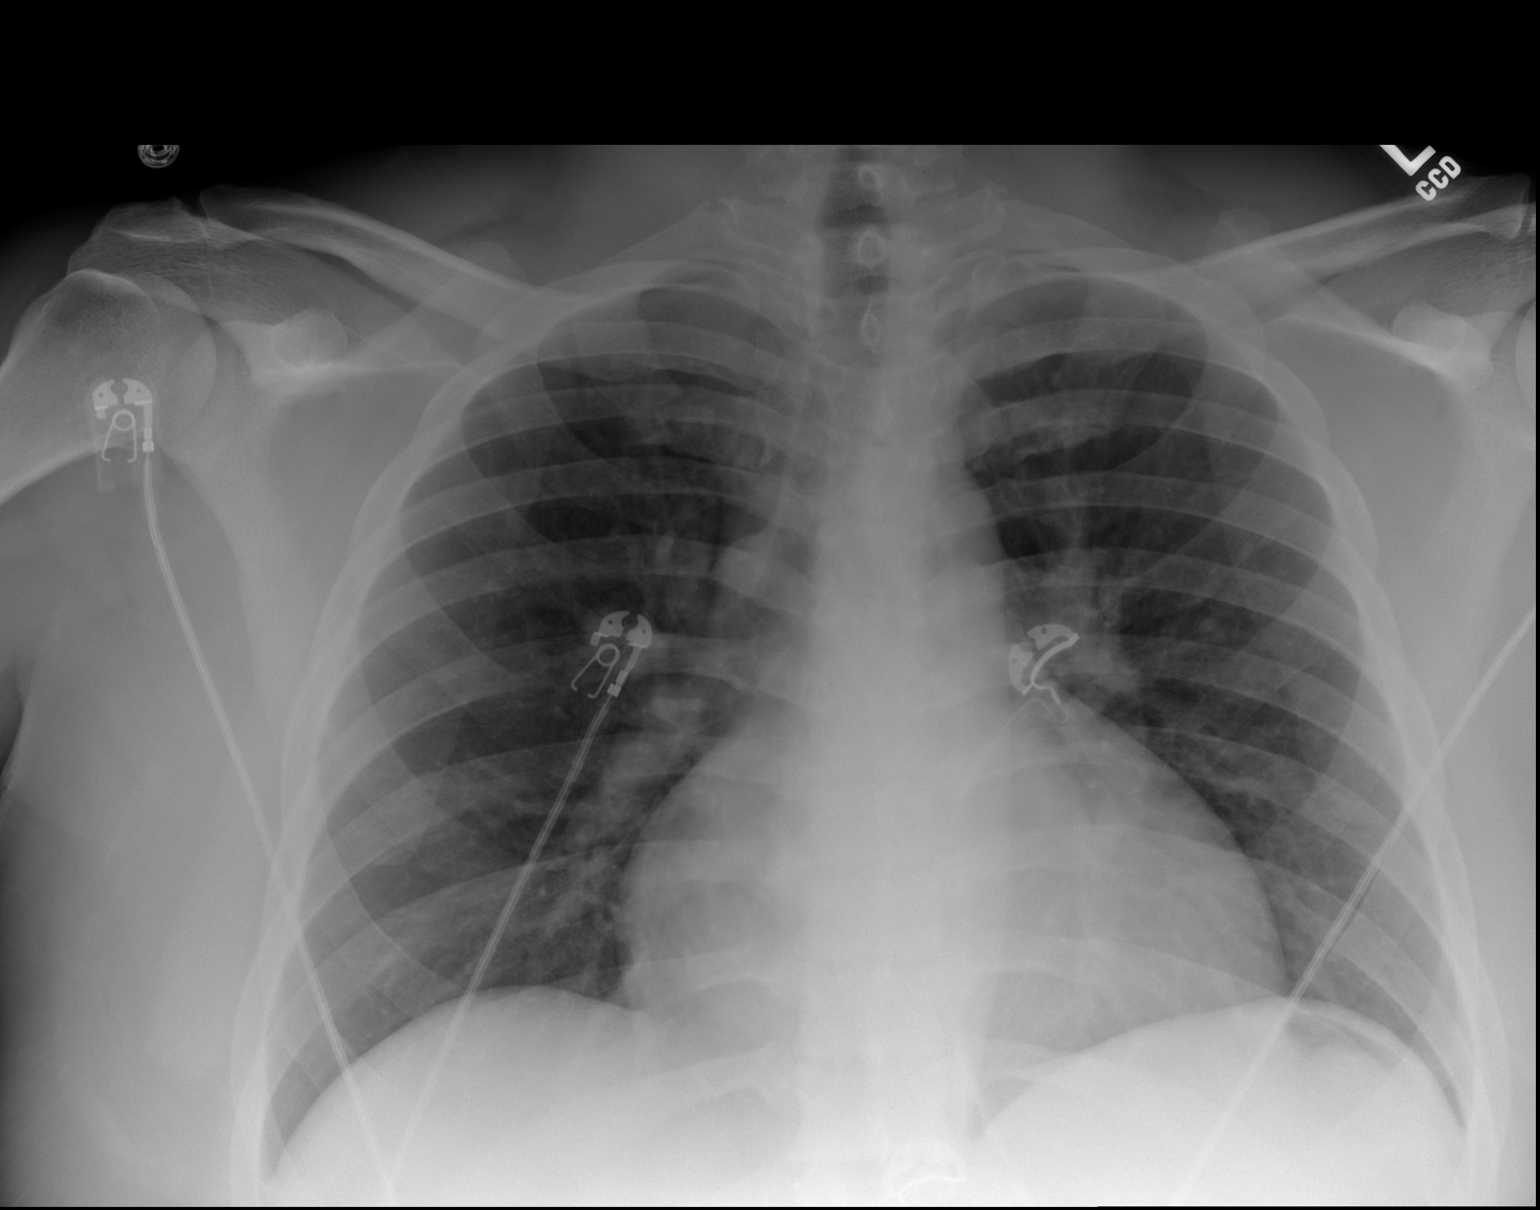

[w chest lat]
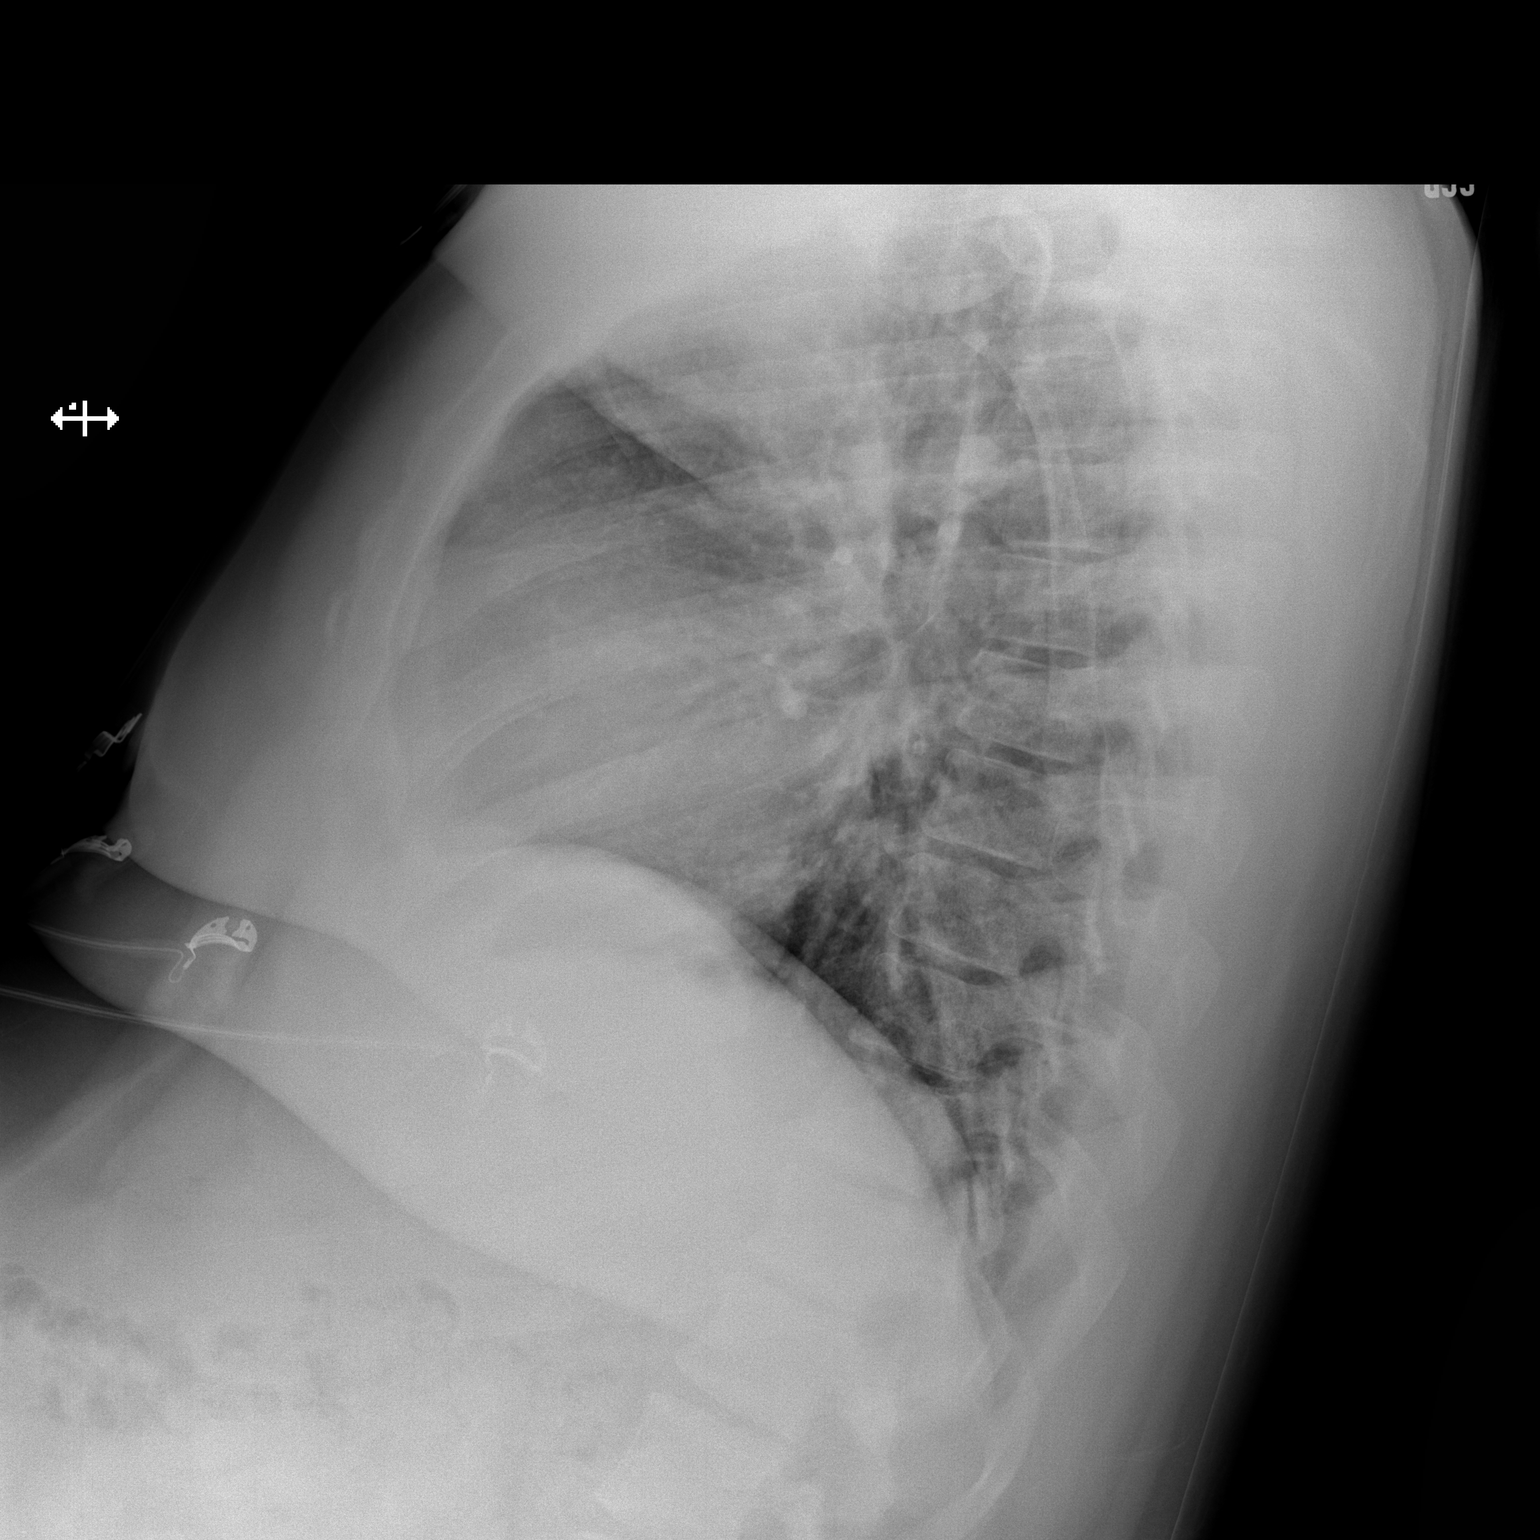

[2 of 2 positions shown; findings below may reference images not displayed]

FINDINGS: Mediastinum and hilar structures normal. Lungs are clear. No pleural
effusion or pneumothorax. Cardiomegaly with mild pulmonary vascular
prominence. No acute bony abnormality.
IMPRESSION: 1.  Cardiomegaly with mild pulmonary vascular prominence.

2.  No acute or focal pulmonary abnormality identified

## 2019-05-28 IMAGING — CT CT ANGIO CHEST-ABD-PELV FOR DISSECTION W/ AND WO/W CM
2 of 7 series · 14 of 46 positions shown, 16 images · IV contrast (iopamidol)
Comparison: One-view chest x-ray 05/23/2018.

CLINICAL DATA: Acute chest and back pain. Aortic dissection
suspected. Sudden onset of shortness of breath. Syncopal episode.

EXAM:
CT ANGIOGRAPHY CHEST, ABDOMEN AND PELVIS
TECHNIQUE: Multidetector CT imaging through the chest, abdomen and pelvis was
performed using the standard protocol during bolus administration of
intravenous contrast. Multiplanar reconstructed images and MIPs were
obtained and reviewed to evaluate the vascular anatomy.
CONTRAST:  100mL 8YPWZG-WRF IOPAMIDOL (8YPWZG-WRF) INJECTION 76%

[Series 8: axial arterial · axial · arterial · 0.84mm/px · z∈[-558,-12]mm · 11 of 208 slices shown, 13 images]
[im 13/208  soft-tissue]
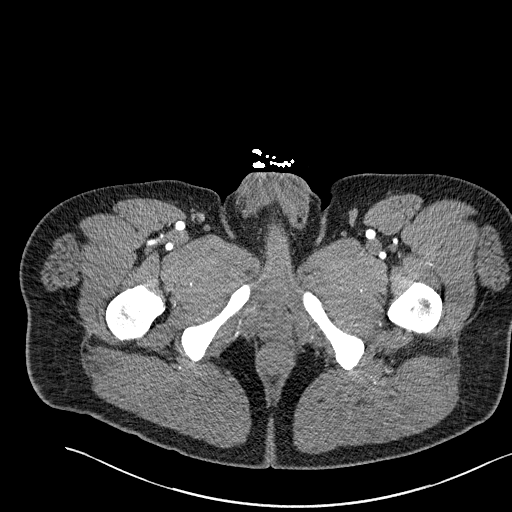
[im 13/208  bone]
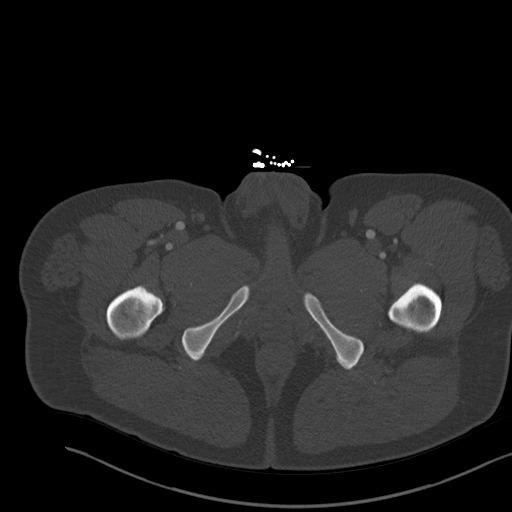
[im 39/208  soft-tissue]
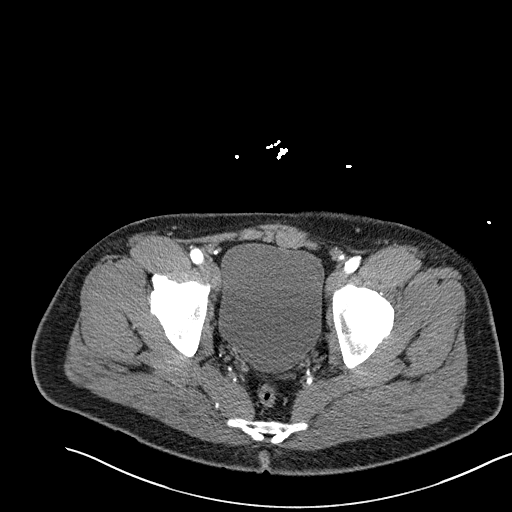
[im 52/208  soft-tissue]
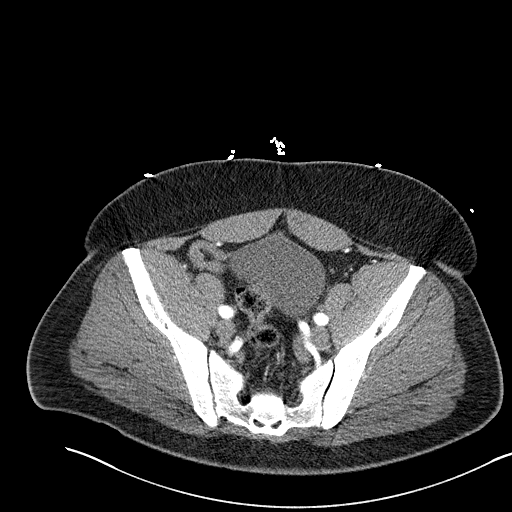
[im 65/208  soft-tissue]
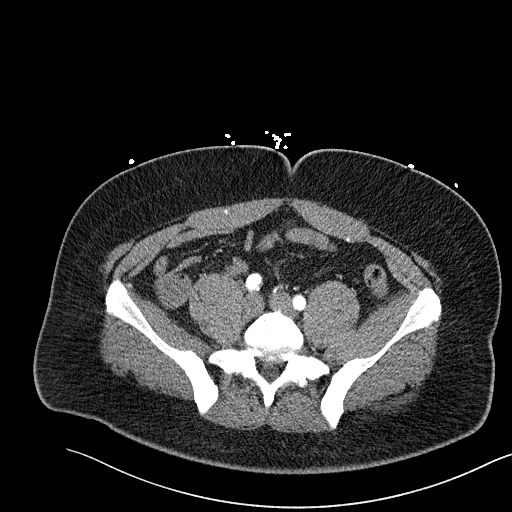
[im 91/208  soft-tissue]
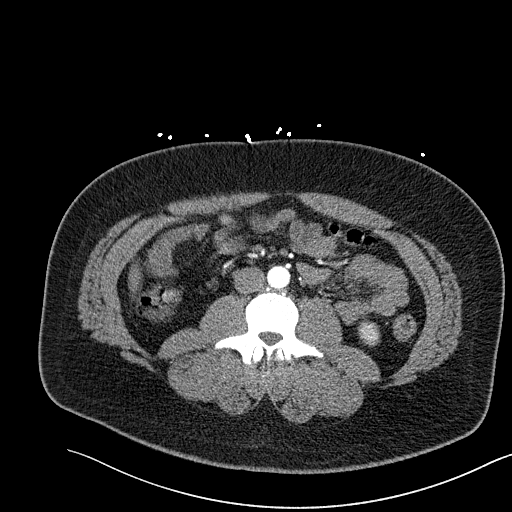
[im 104/208  soft-tissue]
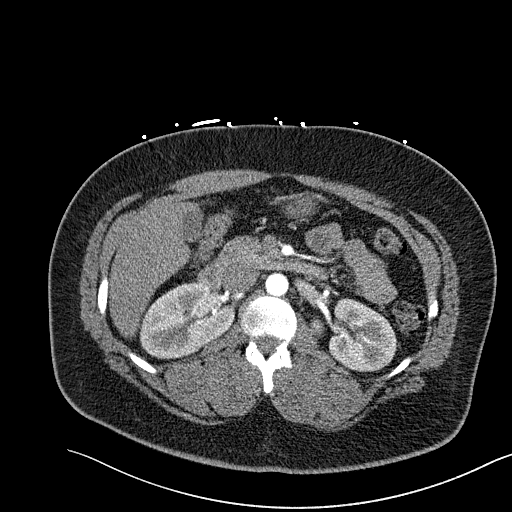
[im 117/208  soft-tissue]
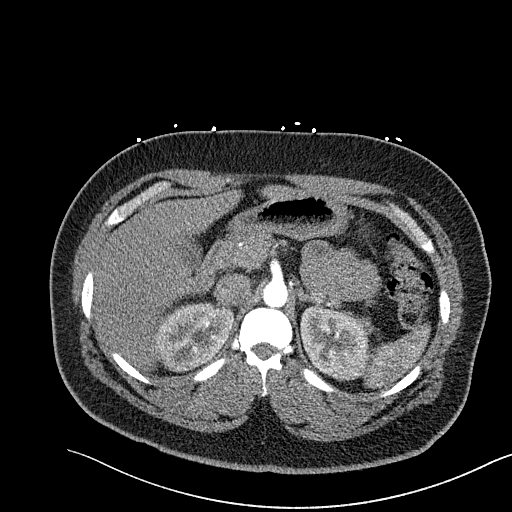
[im 143/208  soft-tissue]
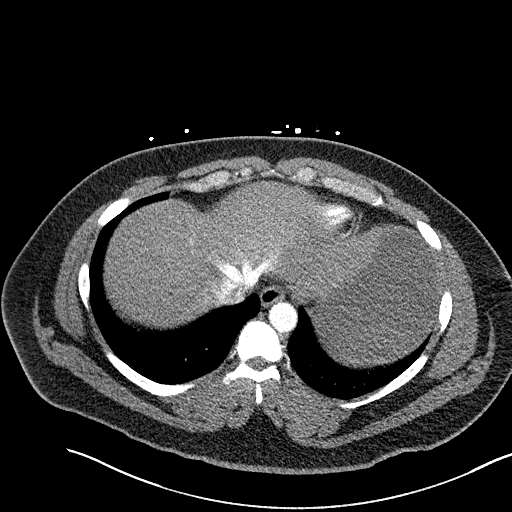
[im 156/208  soft-tissue]
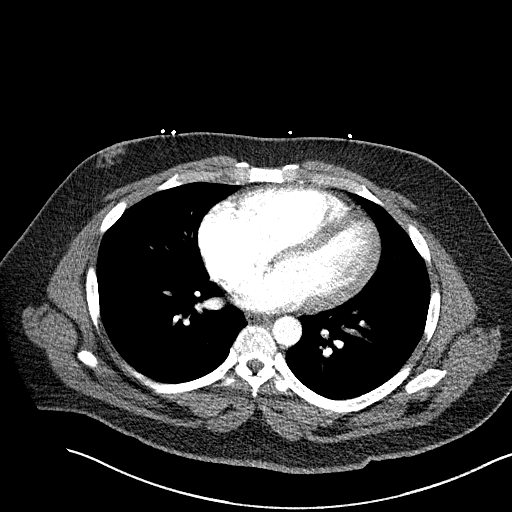
[im 156/208  bone]
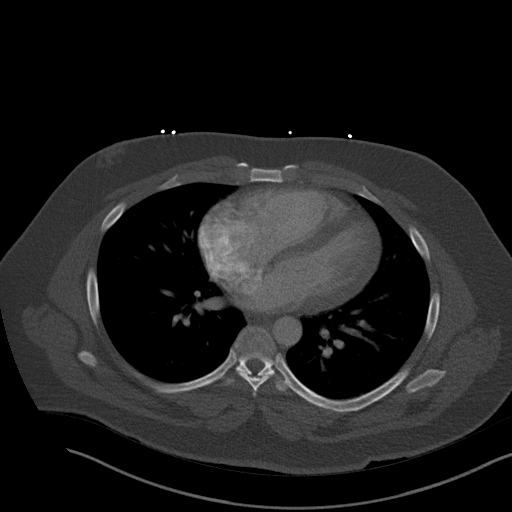
[im 169/208  soft-tissue]
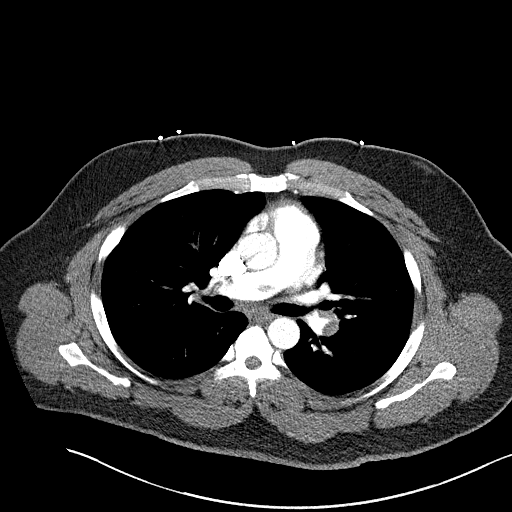
[im 195/208  soft-tissue]
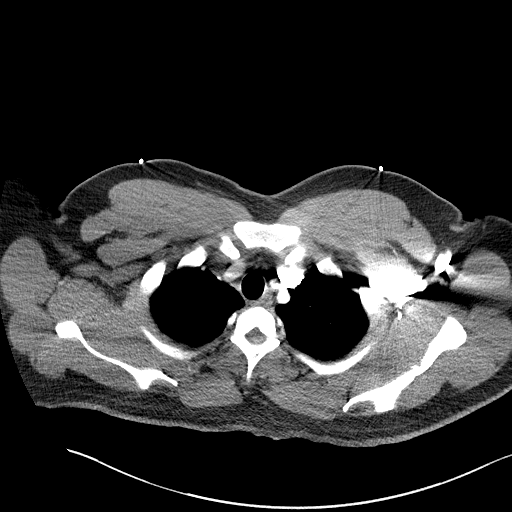

[Series 11: coronals · coronal · 0.89mm/px · 3 of 156 slices shown]
[im 39/156  soft-tissue]
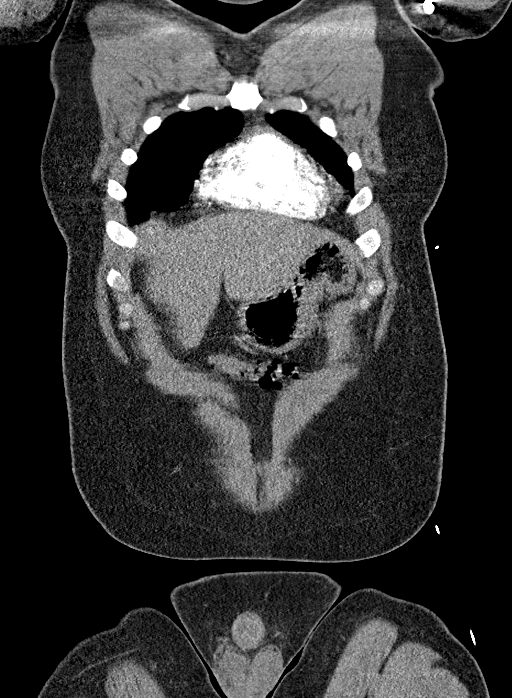
[im 78/156  soft-tissue]
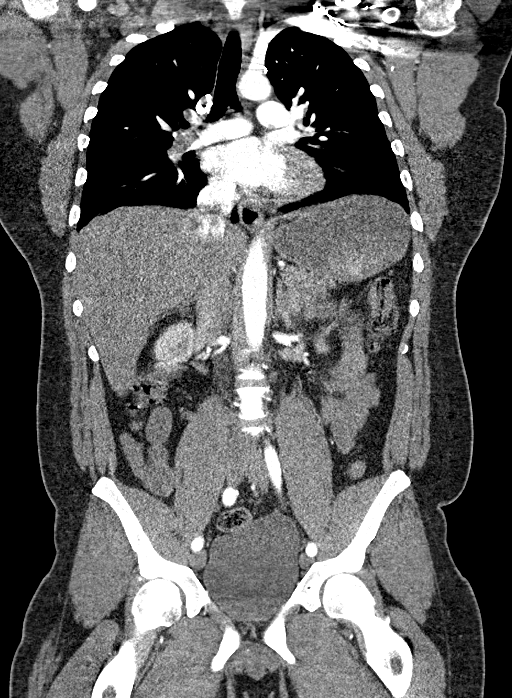
[im 117/156  soft-tissue]
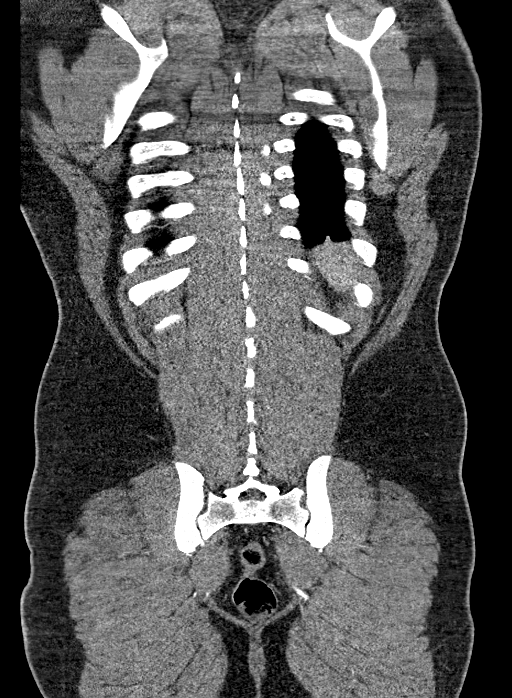

[14 of 46 positions shown; findings below may reference images not displayed]

FINDINGS: CTA CHEST FINDINGS

Cardiovascular: Heart size is normal. Aortic arch is normal. A 3
vessel arch configuration is present. No focal stenosis, aneurysm,
or dissection is present. No significant pericardial effusion is
present.

A saddle pulmonary embolus is present. There is clot stretching
across the bifurcation of the main pulmonary artery. Near occlusive
thrombus is present in the proximal lobar arteries bilaterally.

The right ventricle measures 4.1 cm maximally. The left ventricle
measures 3.8 cm maximally.

Mediastinum/Nodes: No significant mediastinal, hilar, or axillary
adenopathy is present.

Lungs/Pleura: Lungs are clear without focal nodule, mass, or
airspace disease. No significant pleural effusion is present.

Musculoskeletal: Vertebral body heights alignment are maintained.
Ribs are unremarkable. No focal lytic or blastic lesions are
present.

Review of the MIP images confirms the above findings.

CTA ABDOMEN AND PELVIS FINDINGS

VASCULAR

Aorta: Normal caliber.  No dissection.

Celiac: Patent without evidence of aneurysm, dissection, vasculitis
or significant stenosis.

SMA: Patent without evidence of aneurysm, dissection, vasculitis or
significant stenosis.

Renals: Both renal arteries are patent without evidence of aneurysm,
dissection, vasculitis, fibromuscular dysplasia or significant
stenosis.

IMA: Patent without evidence of aneurysm, dissection, vasculitis or
significant stenosis.

Inflow: Patent without evidence of aneurysm, dissection, vasculitis
or significant stenosis.

Veins: No obvious venous abnormality within the limitations of this
arterial phase study.

Review of the MIP images confirms the above findings.

NON-VASCULAR

Hepatobiliary: Arterial phase imaging demonstrates no focal hepatic
lesions. Liver contour is smooth. Liver parenchyma is hypodense. The
common bile duct and gallbladder are normal.

Pancreas: Unremarkable. No pancreatic ductal dilatation or
surrounding inflammatory changes.

Spleen: Normal in size without focal abnormality.

Adrenals/Urinary Tract: Adrenal glands are normal bilaterally.
Kidneys and ureters are unremarkable. The urinary bladder is mildly
distended, but within normal limits.

Stomach/Bowel: Stomach is mildly distended. No obstructing lesion is
present. The duodenum is normal. Small bowel is unremarkable.
Terminal ileum is within normal limits. The appendix is visualized
and normal. The ascending and transverse colon are within normal
limits. The descending and sigmoid colon are normal.

Lymphatic: No significant vascular findings are present. No enlarged
abdominal or pelvic lymph nodes.

Reproductive: Prostate is unremarkable.

Other: No abdominal wall hernia or abnormality. No abdominopelvic
ascites.

Musculoskeletal: Vertebral body heights and alignment are normal.
Bony pelvis is within normal limits. The hips are located and within
normal limits.

Review of the MIP images confirms the above findings.
IMPRESSION: 1. Saddle pulmonary embolus with near occlusive thrombi involving
the proximal lobar arteries bilaterally.
2.  Large volume of pulmonary emboli in the lungs bilaterally, with
dilatation of the right atrium and right ventricle (RV to LV ratio
of 1.1) indicative of elevated right-sided heart pressures and
right heart strain. These findings have been shown to be associated
with a increased morbidity and mortality in the setting pulmonary
embolism.
3. Normal appearance of the aorta and branch vessels without focal
stenosis, aneurysm, or aortic dissection.
4. No significant pulmonary parenchymal disease.
5. Probable hepatic steatosis.
6. CT imaging of the abdomen is otherwise unremarkable.

Critical Value/emergent results were called by telephone at the time
of interpretation on 05/23/2018 at [DATE] to Dr. AKIM LAWS ,
who verbally acknowledged these results.

## 2020-05-02 ENCOUNTER — Encounter (HOSPITAL_COMMUNITY): Payer: Self-pay

## 2020-05-02 ENCOUNTER — Other Ambulatory Visit: Payer: Self-pay

## 2020-05-02 ENCOUNTER — Emergency Department (HOSPITAL_COMMUNITY)
Admission: EM | Admit: 2020-05-02 | Discharge: 2020-05-02 | Disposition: A | Discharge status: Home / Self Care | Payer: Self-pay | Attending: Emergency Medicine | Admitting: Emergency Medicine

## 2020-05-02 ENCOUNTER — Emergency Department (HOSPITAL_BASED_OUTPATIENT_CLINIC_OR_DEPARTMENT_OTHER): Payer: Self-pay

## 2020-05-02 DIAGNOSIS — Z7901 Long term (current) use of anticoagulants: Secondary | ICD-10-CM | POA: Insufficient documentation

## 2020-05-02 DIAGNOSIS — F17210 Nicotine dependence, cigarettes, uncomplicated: Secondary | ICD-10-CM | POA: Insufficient documentation

## 2020-05-02 DIAGNOSIS — M79662 Pain in left lower leg: Secondary | ICD-10-CM | POA: Insufficient documentation

## 2020-05-02 DIAGNOSIS — I10 Essential (primary) hypertension: Secondary | ICD-10-CM | POA: Insufficient documentation

## 2020-05-02 DIAGNOSIS — R609 Edema, unspecified: Secondary | ICD-10-CM

## 2020-05-02 DIAGNOSIS — Z79899 Other long term (current) drug therapy: Secondary | ICD-10-CM | POA: Insufficient documentation

## 2020-05-02 HISTORY — DX: Other pulmonary embolism without acute cor pulmonale: I26.99

## 2020-05-02 HISTORY — DX: Acute embolism and thrombosis of unspecified deep veins of unspecified proximal lower extremity: I82.4Y9

## 2020-05-02 NOTE — Progress Notes (Signed)
Lower extremity venous has been completed.   Preliminary results in CV Proc.   Blanch Media 05/02/2020 3:41 PM

## 2020-05-02 NOTE — ED Triage Notes (Signed)
Patient c/o left calf pain x 3-4 days ago. Patient has a history of PE's.

## 2020-05-02 NOTE — ED Provider Notes (Signed)
[facility] DEPT Provider Note   CSN: [id number] Arrival date & time: 05/02/20  1314     History Chief Complaint  Patient presents with  . calf pain    Kevin [name] is a 34 y.o. male presenting for evaluation of left calf pain. Patient states he has had persistent left calf pain of the left 3 to 4 days.  Is worse when he is walking and with certain positions.  He denies numbness or tingling.  He denies fall, trauma, or injury.  He has a history of previous PE, was on Eliquis but has not been on it for about a year.  There is no known cause or treatment for the PE.  He denies symptoms on the right side.  Has not taken anything for his symptoms.  It does not radiate.  HPI     Past Medical History:  Diagnosis Date  . DVT of proximal leg (deep vein thrombosis) ([place])   . Hypertension   . Pulmonary embolus [facility])     Patient Active Problem List   Diagnosis Date Noted  . Pulmonary embolism ([place]) 05/23/2018  . Demand ischemia ([place]) 05/23/2018    History reviewed. No pertinent surgical history.     Family History  Problem Relation Age of Onset  . Heart failure Mother     Social History   Tobacco Use  . Smoking status: Current Some Day Smoker    Types: Cigarettes  . Smokeless tobacco: Never Used  Substance Use Topics  . Alcohol use: Not Currently  . Drug use: Yes    Types: Marijuana    Comment: occasionally    Home Medications Prior to Admission medications   Medication Sig Start Date End Date Taking? Authorizing Provider  amLODipine (NORVASC) 10 MG tablet Take 1 tablet (10 mg total) by mouth daily. 07/05/18   Nicholas Lose, MD  apixaban (ELIQUIS) 5 MG TABS tablet Take 1 tablet (5 mg total) by mouth 2 (two) times daily. 06/07/18   Nicholas Lose, MD    Allergies    Patient has no known allergies.  Review of Systems   Review of Systems  Musculoskeletal: Positive for myalgias.  Neurological: Negative for numbness.  Hematological: Does  not bruise/bleed easily.    Physical Exam Updated Vital Signs BP (!) 165/104 (BP Location: Right Arm)   Pulse 81   Temp 98.6 F (37 C) (Oral)   Resp 16   Ht 5' 7.5" (1.715 m)   Wt 99.8 kg   SpO2 98%   BMI 33.95 kg/m   Physical Exam Vitals and nursing note reviewed.  Constitutional:      General: He is not in acute distress.    Appearance: He is well-developed.     Comments: Appears nontoxic  HENT:     Head: Normocephalic and atraumatic.  Pulmonary:     Effort: Pulmonary effort is normal.  Abdominal:     General: There is no distension.  Musculoskeletal:        General: Normal range of motion.     Cervical back: Normal range of motion.     Comments: No erythema, warmth, or swelling.  No reproducible with tenderness.  Pedal pulses 2+ bilaterally.  No edema.  Strength of flexion extension at the knee and ankle equal bilaterally.  Skin:    General: Skin is warm.     Capillary Refill: Capillary refill takes less than 2 seconds.     Findings: No rash.  Neurological:     Mental  Status: He is alert and oriented to person, place, and time.     ED Results / Procedures / Treatments   Labs (all labs ordered are listed, but only abnormal results are displayed) Labs Reviewed - No data to display  EKG None  Radiology No results found.  Procedures Procedures (including critical care time)  Medications Ordered in ED Medications - No data to display  ED Course  I have reviewed the triage vital signs and the nursing notes.  Pertinent labs & imaging results that were available during my care of the patient were reviewed by me and considered in my medical decision making (see chart for details).    MDM Rules/Calculators/A&P                      Patient presenting for evaluation of left calf pain.  On exam, patient is nontoxic.  Exam is not consistent with infection.  Due to his previous history of PEs, DVT ultrasound ordered.  Ultrasound negative for acute DVT.   Discussed findings with patient.  Discussed possible MSK injury.  Encourage symptomatic treatment, and follow-up with PCP if symptoms not improving.  At this time, patient appears safe for discharge.  Return precautions given.  Patient states he understands and agrees to plan.  Final Clinical Impression(s) / ED Diagnoses Final diagnoses:  Pain of left calf    Rx / DC Orders ED Discharge Orders    None       Alveria Apley, PA-C 05/02/20 1538    Linwood Dibbles, MD 05/02/20 1710

## 2020-05-02 NOTE — Discharge Instructions (Addendum)
Follow-up with your primary care doctor if your symptoms are not improving. Return to the emergency room with any new, worsening, concerning symptoms.

## 2024-05-30 ENCOUNTER — Encounter: Payer: Self-pay | Admitting: Emergency Medicine

## 2024-05-30 ENCOUNTER — Ambulatory Visit
Admission: EM | Admit: 2024-05-30 | Discharge: 2024-05-30 | Disposition: A | Discharge status: Home / Self Care | Attending: Physician Assistant | Admitting: Physician Assistant

## 2024-05-30 DIAGNOSIS — R109 Unspecified abdominal pain: Secondary | ICD-10-CM | POA: Diagnosis not present

## 2024-05-30 DIAGNOSIS — I10 Essential (primary) hypertension: Secondary | ICD-10-CM | POA: Diagnosis not present

## 2024-05-30 LAB — POCT URINALYSIS DIP (MANUAL ENTRY)
Bilirubin, UA: NEGATIVE
Glucose, UA: NEGATIVE mg/dL
Ketones, POC UA: NEGATIVE mg/dL
Leukocytes, UA: NEGATIVE
Nitrite, UA: NEGATIVE
Protein Ur, POC: NEGATIVE mg/dL
Spec Grav, UA: 1.025 (ref 1.010–1.025)
Urobilinogen, UA: 0.2 U/dL
pH, UA: 6 (ref 5.0–8.0)

## 2024-05-30 NOTE — ED Triage Notes (Signed)
 Right side flank pain and lower back pain x 1 year.    Did an over the counter UTI test and it was positive 1 hour ago.

## 2024-05-30 NOTE — Discharge Instructions (Signed)
 Your urine had a little bit of blood but was otherwise normal.  We will send this for culture and contact you if need to start any antibiotics.  I suspect your pain is more related to a muscle injury.  Take Tylenol  for pain relief and use heat and gentle stretch.  If anything changes or worsens and you have fever, nausea, vomiting you need to be seen immediately.  Your blood pressure was very elevated today.  Please monitor this at home.  If this is persistently above 140/90 you need to return so we can consider medication.  If you develop any chest pain, shortness of breath, headache, vision change, dizziness in the setting of high blood pressure you need to go to the emergency room.

## 2024-05-30 NOTE — ED Provider Notes (Signed)
 [facility]    CSN: [id number] Arrival date & time: 05/30/24  1645      History   Chief Complaint No chief complaint on file.   HPI Kevin [name] is a 38 y.o. male.   Patient presents today with a prolonged (more than a year) history of right flank pain and lower back pain.  He reports that symptoms are intermittent without identifiable trigger but generally are mild and rated 1/2 on a 0 to pain scale.  He denies any colicky nature to the pain and denies any urinary symptoms including frequency, urgency, hematuria, difficulty initiating a stream.  He has no concern for STI.  Denies any associated rash.  He has not been taking over-the-counter medication.  He has always assumed that this is related to a muscular injury as he does play a lot of sports and believes that this exacerbates the pain, however, today he decided to take an Azo test to see if he has a UTI over-the-counter and it was inconclusive prompting evaluation today.  He denies any history of nephrolithiasis.  Denies any associated fever, nausea, vomiting.  Denies any history of diabetes and does not take an SGLT2 inhibitor.  Blood pressure is elevated and has been elevated with the last several readings.  He does report previous diagnosis of hypertension but is never taken antihypertensive medications as he was concerned about side effects.  He does follow with a primary care.  He monitors his blood pressure regularly at home and reports that this is generally much better with systolic between 643 and 150 and diastolic between 80 and 100.  He denies any chest pain, shortness of breath, headache, vision change, dizziness.    Past Medical History:  Diagnosis Date   DVT of proximal leg (deep vein thrombosis) ([facility])    Hypertension    Pulmonary embolus [facility])     Patient Active Problem List   Diagnosis Date Noted   Pulmonary embolism ([facility]) 05/23/2018   Demand ischemia ([facility]) 05/23/2018    History reviewed. No  pertinent surgical history.     Home Medications    Prior to Admission medications   Not on File    Family History Family History  Problem Relation Age of Onset   Heart failure Mother     Social History Social History   Tobacco Use   Smoking status: Some Days    Types: Cigarettes   Smokeless tobacco: Never  Vaping Use   Vaping status: Never Used  Substance Use Topics   Alcohol use: Not Currently   Drug use: Yes    Types: Marijuana    Comment: occasionally     Allergies   Patient has no known allergies.   Review of Systems Review of Systems  Constitutional:  Positive for activity change. Negative for appetite change, fatigue and fever.  Eyes:  Negative for visual disturbance.  Respiratory:  Negative for shortness of breath.   Cardiovascular:  Negative for chest pain, palpitations and leg swelling.  Gastrointestinal:  Negative for abdominal pain, diarrhea, nausea and vomiting.  Genitourinary:  Positive for flank pain. Negative for dysuria, frequency, genital sores, hematuria and urgency.  Musculoskeletal:  Positive for back pain. Negative for arthralgias and myalgias.  Neurological:  Negative for dizziness, light-headedness and headaches.     Physical Exam Triage Vital Signs ED Triage Vitals  Encounter Vitals Group     BP 05/30/24 1654 (!) 167/103     Systolic BP Percentile --      Diastolic  BP Percentile --      Pulse Rate 05/30/24 1654 76     Resp 05/30/24 1654 18     Temp 05/30/24 1654 98.3 F (36.8 C)     Temp Source 05/30/24 1654 Oral     SpO2 05/30/24 1654 96 %     Weight --      Height --      Head Circumference --      Peak Flow --      Pain Score 05/30/24 1656 2     Pain Loc --      Pain Education --      Exclude from Growth Chart --    No data found.  Updated Vital Signs BP (!) 171/96 (BP Location: Left Arm)   Pulse 76   Temp 98.3 F (36.8 C) (Oral)   Resp 18   SpO2 96%   Visual Acuity Right Eye Distance:   Left Eye  Distance:   Bilateral Distance:    Right Eye Near:   Left Eye Near:    Bilateral Near:     Physical Exam Vitals reviewed.  Constitutional:      General: He is awake.     Appearance: Normal appearance. He is well-developed. He is not ill-appearing.     Comments: Very pleasant male appears stated age in no acute distress sitting comfortably in exam room  HENT:     Head: Normocephalic and atraumatic.  Cardiovascular:     Rate and Rhythm: Normal rate and regular rhythm.     Heart sounds: Normal heart sounds, S1 normal and S2 normal. No murmur heard. Pulmonary:     Effort: Pulmonary effort is normal.     Breath sounds: Normal breath sounds. No stridor. No wheezing, rhonchi or rales.     Comments: Clear to auscultation bilaterally Abdominal:     General: Bowel sounds are normal.     Palpations: Abdomen is soft.     Tenderness: There is no abdominal tenderness. There is no right CVA tenderness, left CVA tenderness, guarding or rebound.     Comments: Benign abdominal exam.  No CVA tenderness.  Musculoskeletal:     Cervical back: No tenderness or bony tenderness.     Thoracic back: No tenderness or bony tenderness.     Lumbar back: No tenderness or bony tenderness.     Comments: No pain percussion of vertebrae.  No tenderness palpation of paraspinal muscles.  Full active range of motion with flexion, extension, rotation.  Neurological:     Mental Status: He is alert.  Psychiatric:        Behavior: Behavior is cooperative.      UC Treatments / Results  Labs (all labs ordered are listed, but only abnormal results are displayed) Labs Reviewed  POCT URINALYSIS DIP (MANUAL ENTRY) - Abnormal; Notable for the following components:      Result Value   Blood, UA trace-intact (*)    All other components within normal limits  URINE CULTURE    EKG   Radiology No results found.  Procedures Procedures (including critical care time)  Medications Ordered in UC Medications - No  data to display  Initial Impression / Assessment and Plan / UC Course  I have reviewed the triage vital signs and the nursing notes.  Pertinent labs & imaging results that were available during my care of the patient were reviewed by me and considered in my medical decision making (see chart for details).     Patient is  well-appearing, afebrile, nontoxic, nontachycardic.  UA was obtained that showed trace blood but no other evidence of infection.  Will send this for culture but defer antibiotics until culture results are available.  Patient had no concern for STI so this was deferred.  I suspect that his flank pain is more related to muscle injuries particularly as it has been associated with physical activity.  Recommended that he use over-the-counter medications or Tylenol , heat, stretch for symptom relief.  We discussed that if he has any worsening or changing symptoms including increasing pain, urinary symptoms, fever, nausea, vomiting he needs to be seen immediately.  Strict return precautions given.  All questions answered to patient satisfaction.  Blood pressure is elevated.  He denies any signs/symptoms of endorgan damage.  Recommend that he continue monitoring this at home and keep a log for evaluation of follow-up appointment.  We did discuss long-term effects of elevated blood pressure and potential utility of starting medication, however, he reports that he often is anxious in a doctor's office and does not believe that his blood pressure is generally this elevated so would prefer to monitor this at home and follow-up with his PCP.  Discussed that if he develops any chest pain, shortness of breath, headache, vision change, dizziness in setting of high blood pressure he needs to be seen immediately.  Final Clinical Impressions(s) / UC Diagnoses   Final diagnoses:  Flank pain  Elevated blood pressure reading with diagnosis of hypertension     Discharge Instructions      Your urine  had a little bit of blood but was otherwise normal.  We will send this for culture and contact you if need to start any antibiotics.  I suspect your pain is more related to a muscle injury.  Take Tylenol  for pain relief and use heat and gentle stretch.  If anything changes or worsens and you have fever, nausea, vomiting you need to be seen immediately.  Your blood pressure was very elevated today.  Please monitor this at home.  If this is persistently above 140/90 you need to return so we can consider medication.  If you develop any chest pain, shortness of breath, headache, vision change, dizziness in the setting of high blood pressure you need to go to the emergency room.   ED Prescriptions   None    PDMP not reviewed this encounter.   Budd Cargo, PA-C 05/30/24 1724

## 2024-05-31 LAB — URINE CULTURE: Culture: NO GROWTH

## 2024-06-01 ENCOUNTER — Ambulatory Visit (HOSPITAL_COMMUNITY): Payer: Self-pay
